# Patient Record
Sex: Male | Born: 1985
Health system: Southern US, Community
[De-identification: ages and names within clinical notes are randomized; demographics above are authoritative.]

## PROBLEM LIST (undated history)

## (undated) DIAGNOSIS — J45909 Unspecified asthma, uncomplicated: Secondary | ICD-10-CM

## (undated) DIAGNOSIS — F909 Attention-deficit hyperactivity disorder, unspecified type: Secondary | ICD-10-CM

## (undated) DIAGNOSIS — T7840XA Allergy, unspecified, initial encounter: Secondary | ICD-10-CM

## (undated) DIAGNOSIS — G4730 Sleep apnea, unspecified: Secondary | ICD-10-CM

## (undated) HISTORY — PX: MANDIBLE SURGERY: SHX707

## (undated) HISTORY — PX: TOE SURGERY: SHX1073

## (undated) HISTORY — DX: Attention-deficit hyperactivity disorder, unspecified type: F90.9

## (undated) HISTORY — DX: Sleep apnea, unspecified: G47.30

---

## 2005-07-13 ENCOUNTER — Emergency Department: Payer: Self-pay | Admitting: General Practice

## 2008-12-21 ENCOUNTER — Emergency Department: Payer: Self-pay | Admitting: Emergency Medicine

## 2009-02-08 ENCOUNTER — Emergency Department: Payer: Self-pay | Admitting: Emergency Medicine

## 2009-08-10 ENCOUNTER — Ambulatory Visit: Payer: Self-pay | Admitting: Otolaryngology

## 2012-01-05 ENCOUNTER — Emergency Department: Payer: Self-pay | Admitting: Emergency Medicine

## 2013-03-18 ENCOUNTER — Emergency Department: Payer: Self-pay | Admitting: Emergency Medicine

## 2016-09-07 ENCOUNTER — Ambulatory Visit (INDEPENDENT_AMBULATORY_CARE_PROVIDER_SITE_OTHER): Payer: Managed Care, Other (non HMO) | Admitting: Podiatry

## 2016-09-07 ENCOUNTER — Encounter: Payer: Self-pay | Admitting: Podiatry

## 2016-09-07 VITALS — BP 124/97 | HR 104 | Resp 16

## 2016-09-07 DIAGNOSIS — M79676 Pain in unspecified toe(s): Secondary | ICD-10-CM

## 2016-09-07 DIAGNOSIS — L600 Ingrowing nail: Secondary | ICD-10-CM

## 2016-09-07 DIAGNOSIS — L03039 Cellulitis of unspecified toe: Secondary | ICD-10-CM

## 2016-09-07 NOTE — Patient Instructions (Signed)

## 2016-09-17 NOTE — Progress Notes (Signed)
   Subjective: Patient presents today for evaluation of pain in toe(s). Patient is concerned for possible ingrown nail. Patient states that the pain has been present for a few weeks now. Patient presents today for further treatment and evaluation.  Objective:  General: Well developed, nourished, in no acute distress, alert and oriented x3   Dermatology: Skin is warm, dry and supple bilateral. Lateral border of the left great toe appears to be erythematous with evidence of an ingrowing nail. Pain on palpation noted to the border of the nail fold. The remaining nails appear unremarkable at this time. There are no open sores, lesions.  Vascular: Dorsalis Pedis artery and Posterior Tibial artery pedal pulses palpable. No lower extremity edema noted.   Neruologic: Grossly intact via light touch bilateral.  Musculoskeletal: Muscular strength within normal limits in all groups bilateral. Normal range of motion noted to all pedal and ankle joints.   Assesement: #1 Paronychia with ingrowing nail lateral border left great toe #2 Pain in toe #3 Incurvated nail  Plan of Care:  1. Patient evaluated.  2. Discussed treatment alternatives and plan of care. Explained nail avulsion procedure and post procedure course to patient. 3. Patient opted for permanent partial nail avulsion.  4. Prior to procedure, local anesthesia infiltration utilized using 3 ml of a 50:50 mixture of 2% plain lidocaine and 0.5% plain marcaine in a normal hallux block fashion and a betadine prep performed.  5. Partial permanent nail avulsion with chemical matrixectomy performed using [id number] applications of phenol followed by alcohol flush.  6. Light dressing applied. 7. Return to clinic in 2 weeks.   Edrick Kins, DPM [facility]  Dr. Edrick Kins, [place]                                        [place], [place] [address]                Office [phone]  Fax [phone]

## 2016-09-21 ENCOUNTER — Ambulatory Visit: Payer: Managed Care, Other (non HMO) | Admitting: Podiatry

## 2016-09-21 ENCOUNTER — Ambulatory Visit (INDEPENDENT_AMBULATORY_CARE_PROVIDER_SITE_OTHER): Payer: Managed Care, Other (non HMO) | Admitting: Podiatry

## 2016-09-21 DIAGNOSIS — M79676 Pain in unspecified toe(s): Secondary | ICD-10-CM

## 2016-09-21 DIAGNOSIS — S91209D Unspecified open wound of unspecified toe(s) with damage to nail, subsequent encounter: Secondary | ICD-10-CM

## 2016-09-21 DIAGNOSIS — S91109D Unspecified open wound of unspecified toe(s) without damage to nail, subsequent encounter: Secondary | ICD-10-CM

## 2016-09-21 NOTE — Progress Notes (Signed)
   Subjective: Patient presents today 2 weeks post ingrown nail permanent nail avulsion procedure. Patient states that the toe and nail fold is feeling much better.  Objective: Skin is warm, dry and supple. Nail and respective nail fold appears to be healing appropriately. Open wound to the associated nail fold with a granular wound base and moderate amount of fibrotic tissue. Minimal drainage noted. Mild erythema around the periungual region likely due to phenol chemical matricectomy.  Assessment: #1 postop permanent partial nail avulsion #2 open wound periungual nail fold of respective digit.   Plan of care: #1 patient was evaluated  #2 debridement of open wound was performed to the periungual border of the respective toe using a currette. Antibiotic ointment and Band-Aid was applied. #3 patient is to return to clinic on a PRN  basis.   Edrick Kins, DPM [facility]  Dr. Edrick Kins, [place]                                        [place], [place] [address]                Office [phone]  Fax [phone]

## 2017-05-03 ENCOUNTER — Encounter
Admission: EM | Disposition: A | Discharge status: Home / Self Care | Payer: Self-pay | Source: Home / Self Care | Attending: Emergency Medicine

## 2017-05-03 ENCOUNTER — Emergency Department
Admission: EM | Admit: 2017-05-03 | Discharge: 2017-05-03 | Disposition: A | Discharge status: Home / Self Care | Payer: Commercial Managed Care - PPO | Attending: Emergency Medicine | Admitting: Emergency Medicine

## 2017-05-03 ENCOUNTER — Emergency Department: Payer: Commercial Managed Care - PPO | Admitting: Certified Registered Nurse Anesthetist

## 2017-05-03 ENCOUNTER — Emergency Department: Payer: Commercial Managed Care - PPO

## 2017-05-03 ENCOUNTER — Other Ambulatory Visit: Payer: Self-pay

## 2017-05-03 ENCOUNTER — Encounter: Payer: Self-pay | Admitting: Emergency Medicine

## 2017-05-03 DIAGNOSIS — Z79899 Other long term (current) drug therapy: Secondary | ICD-10-CM | POA: Insufficient documentation

## 2017-05-03 DIAGNOSIS — E669 Obesity, unspecified: Secondary | ICD-10-CM | POA: Insufficient documentation

## 2017-05-03 DIAGNOSIS — X58XXXA Exposure to other specified factors, initial encounter: Secondary | ICD-10-CM | POA: Diagnosis not present

## 2017-05-03 DIAGNOSIS — T18128A Food in esophagus causing other injury, initial encounter: Secondary | ICD-10-CM

## 2017-05-03 DIAGNOSIS — K222 Esophageal obstruction: Secondary | ICD-10-CM | POA: Diagnosis present

## 2017-05-03 DIAGNOSIS — K200 Eosinophilic esophagitis: Secondary | ICD-10-CM | POA: Diagnosis not present

## 2017-05-03 DIAGNOSIS — Z6841 Body Mass Index (BMI) 40.0 and over, adult: Secondary | ICD-10-CM | POA: Insufficient documentation

## 2017-05-03 DIAGNOSIS — R112 Nausea with vomiting, unspecified: Secondary | ICD-10-CM

## 2017-05-03 DIAGNOSIS — Z87891 Personal history of nicotine dependence: Secondary | ICD-10-CM | POA: Insufficient documentation

## 2017-05-03 DIAGNOSIS — J45909 Unspecified asthma, uncomplicated: Secondary | ICD-10-CM | POA: Insufficient documentation

## 2017-05-03 HISTORY — PX: ESOPHAGOGASTRODUODENOSCOPY (EGD) WITH PROPOFOL: SHX5813

## 2017-05-03 HISTORY — PX: FOREIGN BODY REMOVAL: SHX962

## 2017-05-03 HISTORY — DX: Unspecified asthma, uncomplicated: J45.909

## 2017-05-03 SURGERY — REMOVAL, FOREIGN BODY
Anesthesia: General

## 2017-05-03 MED ORDER — SODIUM CHLORIDE 0.9 % IV BOLUS (SEPSIS)
1000.0000 mL | Freq: Once | INTRAVENOUS | Status: AC
  Administered 2017-05-03: 1000 mL via INTRAVENOUS

## 2017-05-03 MED ORDER — SUCCINYLCHOLINE CHLORIDE 20 MG/ML IJ SOLN
INTRAMUSCULAR | Status: DC | PRN
  Administered 2017-05-03: 200 mg via INTRAVENOUS

## 2017-05-03 MED ORDER — PROPOFOL 10 MG/ML IV BOLUS
INTRAVENOUS | Status: DC | PRN
  Administered 2017-05-03: 200 mg via INTRAVENOUS

## 2017-05-03 MED ORDER — FENTANYL CITRATE (PF) 100 MCG/2ML IJ SOLN
25.0000 ug | INTRAMUSCULAR | Status: DC | PRN
  Administered 2017-05-03: 100 ug via INTRAVENOUS

## 2017-05-03 MED ORDER — SODIUM CHLORIDE 0.9 % IV SOLN
INTRAVENOUS | Status: DC
  Administered 2017-05-03: 16:00:00 via INTRAVENOUS

## 2017-05-03 MED ORDER — ONDANSETRON HCL 4 MG/2ML IJ SOLN
INTRAMUSCULAR | Status: AC
  Filled 2017-05-03: qty 2

## 2017-05-03 MED ORDER — FENTANYL CITRATE (PF) 100 MCG/2ML IJ SOLN
INTRAMUSCULAR | Status: AC
  Filled 2017-05-03: qty 2

## 2017-05-03 MED ORDER — SUGAMMADEX SODIUM 500 MG/5ML IV SOLN
INTRAVENOUS | Status: DC | PRN
  Administered 2017-05-03: 272.2 mg via INTRAVENOUS

## 2017-05-03 MED ORDER — GLUCAGON HCL RDNA (DIAGNOSTIC) 1 MG IJ SOLR
1.0000 mg | Freq: Once | INTRAMUSCULAR | Status: AC
  Administered 2017-05-03: 1 mg via INTRAVENOUS
  Filled 2017-05-03 (×2): qty 1

## 2017-05-03 MED ORDER — ONDANSETRON HCL 4 MG/2ML IJ SOLN
4.0000 mg | Freq: Once | INTRAMUSCULAR | Status: AC | PRN
  Administered 2017-05-03: 4 mg via INTRAVENOUS

## 2017-05-03 MED ORDER — SUGAMMADEX SODIUM 500 MG/5ML IV SOLN
INTRAVENOUS | Status: AC
  Filled 2017-05-03: qty 5

## 2017-05-03 MED ORDER — ROCURONIUM BROMIDE 50 MG/5ML IV SOLN
INTRAVENOUS | Status: AC
Start: 2017-05-03 — End: ?
  Filled 2017-05-03: qty 1

## 2017-05-03 MED ORDER — MIDAZOLAM HCL 2 MG/2ML IJ SOLN
INTRAMUSCULAR | Status: DC | PRN
  Administered 2017-05-03: 2 mg via INTRAVENOUS

## 2017-05-03 MED ORDER — SUCCINYLCHOLINE CHLORIDE 20 MG/ML IJ SOLN
INTRAMUSCULAR | Status: AC
  Filled 2017-05-03: qty 1

## 2017-05-03 MED ORDER — PROPOFOL 10 MG/ML IV BOLUS
INTRAVENOUS | Status: AC
  Filled 2017-05-03: qty 20

## 2017-05-03 MED ORDER — ROCURONIUM BROMIDE 100 MG/10ML IV SOLN
INTRAVENOUS | Status: DC | PRN
  Administered 2017-05-03: 10 mg via INTRAVENOUS

## 2017-05-03 MED ORDER — MIDAZOLAM HCL 2 MG/2ML IJ SOLN
INTRAMUSCULAR | Status: AC
  Filled 2017-05-03: qty 2

## 2017-05-03 NOTE — ED Notes (Signed)
Discussed patient with Dr. Clearnce Hasten.  Denied need for orders at this time.  Will continue to monitor patient.

## 2017-05-03 NOTE — Anesthesia Post-op Follow-up Note (Signed)
Anesthesia QCDR form completed.        

## 2017-05-03 NOTE — Anesthesia Preprocedure Evaluation (Signed)
Anesthesia Evaluation  Patient identified by MRN, date of birth, ID band Patient awake    Reviewed: Allergy & Precautions, NPO status , Patient's Chart, lab work & pertinent test results, reviewed documented beta blocker date and time   Airway Mallampati: III  TM Distance: >3 FB     Dental  (+) Chipped   Pulmonary asthma , former smoker,           Cardiovascular      Neuro/Psych    GI/Hepatic   Endo/Other    Renal/GU      Musculoskeletal   Abdominal   Peds  Hematology   Anesthesia Other Findings Obese.  Reproductive/Obstetrics                             Anesthesia Physical Anesthesia Plan  ASA: III  Anesthesia Plan: General   Post-op Pain Management:    Induction: Intravenous  PONV Risk Score and Plan:   Airway Management Planned: Oral ETT  Additional Equipment:   Intra-op Plan:   Post-operative Plan:   Informed Consent: I have reviewed the patients History and Physical, chart, labs and discussed the procedure including the risks, benefits and alternatives for the proposed anesthesia with the patient or authorized representative who has indicated his/her understanding and acceptance.     Plan Discussed with: CRNA  Anesthesia Plan Comments:         Anesthesia Quick Evaluation

## 2017-05-03 NOTE — ED Notes (Signed)
Patient taken to endoscopy

## 2017-05-03 NOTE — Consult Note (Signed)
Steven Carrillo , MD [address] [place], [place], [place], [address] [address], [place], [place], [place], [address] Phone: [phone]  Fax: [phone]  Consultation  Referring Provider:   ER Primary Care Physician:  Patient, No Pcp Per Primary Gastroenterologist: None          Reason for Consultation:     Food impaction   Date of Admission:  05/03/2017 Date of Consultation:  05/03/2017         HPI:   Steven Carrillo is a 31 y.o. male here today after he presented to the ER this morning with food stuck in his throat,says he had a chicken wrap for dinner last night and felt has been stuck since, has had similar episodes in the past which resolved on their own.   H/o asthma , no nsaid use.  Past Medical History:  Diagnosis Date  . Asthma     Past Surgical History:  Procedure Laterality Date  . MANDIBLE SURGERY    . TOE SURGERY      Prior to Admission medications   Medication Sig Start Date End Date Taking? Authorizing Provider  ibuprofen (ADVIL,MOTRIN) 200 MG tablet Take 200 mg every 6 (six) hours as needed by mouth.   Yes [provider]  albuterol (PROVENTIL HFA;VENTOLIN HFA) 108 (90 Base) MCG/ACT inhaler Inhale into the lungs. 08/23/14   [provider]    History reviewed. No pertinent family history.   Social History   Tobacco Use  . Smoking status: Former Research scientist (life sciences)  . Smokeless tobacco: Never Used  Substance Use Topics  . Alcohol use: Yes    Comment: social drinker  . Drug use: No    Allergies as of 05/03/2017  . (No Known Allergies)    Review of Systems:    All systems reviewed and negative except where noted in HPI.   Physical Exam:  Vital signs in last 24 hours: Temp:  [97.9 F (36.6 C)-98.9 F (37.2 C)] 97.9 F (36.6 C) (11/09 1505) Pulse Rate:  [79-83] 83 (11/09 1505) Resp:  [18-20] 20 (11/09 1505) BP: (135-146)/(80-86) 146/86 (11/09 1505) SpO2:  [98 %] 98 % (11/09 1013) Weight:  [300 lb (136.1 kg)] 300 lb (136.1 kg)  (11/09 1505)   General:   Pleasant, cooperative in NAD Head:  Normocephalic and atraumatic. Eyes:   No icterus.   Conjunctiva pink. PERRLA. Ears:  Normal auditory acuity. Neck:  Supple; no masses or thyroidomegaly Lungs: Respirations even and unlabored. Lungs clear to auscultation bilaterally.   No wheezes, crackles, or rhonchi.  Heart:  Regular rate and rhythm;  Without murmur, clicks, rubs or gallops Abdomen:  Soft, nondistended, nontender. Normal bowel sounds. No appreciable masses or hepatomegaly.  No rebound or guarding.  Neurologic:  Alert and oriented x3;  grossly normal neurologically. Skin:  Intact without significant lesions or rashes. Cervical Nodes:  No significant cervical adenopathy. Psych:  Alert and cooperative. Normal affect.  LAB RESULTS: No results for input(s): WBC, HGB, HCT, PLT in the last 72 hours. BMET No results for input(s): NA, K, CL, CO2, GLUCOSE, BUN, CREATININE, CALCIUM in the last 72 hours. LFT No results for input(s): PROT, ALBUMIN, AST, ALT, ALKPHOS, BILITOT, BILIDIR, IBILI in the last 72 hours. PT/INR No results for input(s): LABPROT, INR in the last 72 hours.  STUDIES: Dg Chest 2 View  Result Date: 05/03/2017 CLINICAL DATA:  Esophageal foreign body. EXAM: CHEST  2 VIEW COMPARISON:  None. FINDINGS: The heart size and mediastinal contours are within  normal limits. Both lungs are clear. No evidence of aspiration. No apparent radiopaque foreign body. No esophageal dilatation. The visualized skeletal structures are unremarkable. IMPRESSION: No active cardiopulmonary disease. No radiopaque foreign body identified. No evidence of aspiration. Electronically Signed   By: Ashley Royalty M.D.   On: 05/03/2017 13:19      Impression / Plan:   Steven Carrillo is a 31 y.o. y/o male with a food impaction , history of asthma.   Plan  1. EGD   I have discussed alternative options, risks & benefits,  which include, but are not limited to, bleeding, infection,  perforation,respiratory complication & drug reaction.  The patient agrees with this plan & written consent will be obtained.     Thank you for involving me in the care of this patient.      LOS: 0 days   Steven Bellows, MD  05/03/2017, 3:16 PM

## 2017-05-03 NOTE — H&P (Signed)
           [name], MD [address] [place], [place], [place], [address] [address], [place], [place], [place], [address] Phone: [phone]  Fax: [phone]  Primary Care Physician:  Patient, No Pcp Per   Pre-Procedure History & Physical: HPI:  Steven Carrillo is a 31 y.o. male is here for an endoscopy    Past Medical History:  Diagnosis Date  . Asthma     Past Surgical History:  Procedure Laterality Date  . MANDIBLE SURGERY    . TOE SURGERY      Prior to Admission medications   Medication Sig Start Date End Date Taking? Authorizing Provider  ibuprofen (ADVIL,MOTRIN) 200 MG tablet Take 200 mg every 6 (six) hours as needed by mouth.   Yes [provider]  albuterol (PROVENTIL HFA;VENTOLIN HFA) 108 (90 Base) MCG/ACT inhaler Inhale into the lungs. 08/23/14   [provider]    Allergies as of 05/03/2017  . (No Known Allergies)    History reviewed. No pertinent family history.  Social History   Socioeconomic History  . Marital status: Single    Spouse name: Not on file  . Number of children: Not on file  . Years of education: Not on file  . Highest education level: Not on file  Social Needs  . Financial resource strain: Not on file  . Food insecurity - worry: Not on file  . Food insecurity - inability: Not on file  . Transportation needs - medical: Not on file  . Transportation needs - non-medical: Not on file  Occupational History  . Not on file  Tobacco Use  . Smoking status: Former Research scientist (life sciences)  . Smokeless tobacco: Never Used  Substance and Sexual Activity  . Alcohol use: Yes    Comment: social drinker  . Drug use: No  . Sexual activity: Not on file  Other Topics Concern  . Not on file  Social History Narrative  . Not on file    Review of Systems: See HPI, otherwise negative ROS  Physical Exam: BP (!) 146/86   Pulse 83   Temp 97.9 F (36.6 C) (Tympanic)   Resp 20   Ht 6' (1.829 m)   Wt 300 lb (136.1 kg)   SpO2  98%   BMI 40.69 kg/m  General:   Alert,  pleasant and cooperative in NAD Head:  Normocephalic and atraumatic. Neck:  Supple; no masses or thyromegaly. Lungs:  Clear throughout to auscultation, normal respiratory effort.    Heart:  +S1, +S2, Regular rate and rhythm, No edema. Abdomen:  Soft, nontender and nondistended. Normal bowel sounds, without guarding, and without rebound.   Neurologic:  Alert and  oriented x4;  grossly normal neurologically.  Impression/Plan: Steven Carrillo is here for an endoscopy  to be performed for  A food impaction     Risks, benefits, limitations, and alternatives regarding endoscopy have been reviewed with the patient.  Questions have been answered.  All parties agreeable.   [name], MD  05/03/2017, 3:14 PM

## 2017-05-03 NOTE — Transfer of Care (Signed)
Immediate Anesthesia Transfer of Care Note  Patient: Steven Carrillo  Procedure(s) Performed: FOREIGN BODY REMOVAL (N/A ) ESOPHAGOGASTRODUODENOSCOPY (EGD) WITH PROPOFOL (N/A )  Patient Location: PACU  Anesthesia Type:General  Level of Consciousness: sedated  Airway & Oxygen Therapy: Patient Spontanous Breathing and Patient connected to face mask oxygen  Post-op Assessment: Report given to RN  Post vital signs: Reviewed and stable  Last Vitals:  Vitals:   05/03/17 1013 05/03/17 1505  BP: 135/80 (!) 146/86  Pulse: 79 83  Resp: 18 20  Temp: 37.2 C 36.6 C  SpO2: 98%     Last Pain:  Vitals:   05/03/17 1505  TempSrc: Tympanic  PainSc: 5       Patients Stated Pain Goal: 0 (74/16/38 4536)  Complications: No apparent anesthesia complications

## 2017-05-03 NOTE — ED Provider Notes (Signed)
[facility] Emergency Department Provider Note  ____________________________________________  Time seen: Approximately 2:23 PM  I have reviewed the triage vital signs and the nursing notes.   HISTORY  Chief Complaint Dysphagia    HPI GRANT SWAGER is a 31 y.o. male he complains of chest pain and inability to swallow since yesterday afternoon. At dinnertime yesterday he was eating a chicken wrap, and he felt like it got stuck in his throat. He vomited, but since then has been unable to swallow. He's been vomiting anytime she eats or drinks anything. No fevers or chills. No shortness of breath. Never had anything like this before.     Past Medical History:  Diagnosis Date  . Asthma      There are no active problems to display for this patient.    Past Surgical History:  Procedure Laterality Date  . TOE SURGERY       Prior to Admission medications   Medication Sig Start Date End Date Taking? Authorizing Provider  albuterol (PROVENTIL HFA;VENTOLIN HFA) 108 (90 Base) MCG/ACT inhaler Inhale into the lungs. 08/23/14   [provider]     Allergies Patient has no known allergies.   No family history on file.  Social History Social History   Tobacco Use  . Smoking status: Former Research scientist (life sciences)  . Smokeless tobacco: Never Used  Substance Use Topics  . Alcohol use: Yes    Comment: social drinker  . Drug use: No    Review of Systems  Constitutional:   No fever or chills.  ENT:   No sore throat. No rhinorrhea. Cardiovascular:   central chest pain withoutsyncope. Respiratory:   No dyspnea or cough. Gastrointestinal:   Negative for abdominal pain, vomiting and diarrhea.  Musculoskeletal:   Negative for focal pain or swelling All other systems reviewed and are negative except as documented above in ROS and HPI.  ____________________________________________   PHYSICAL EXAM:  VITAL SIGNS: ED Triage Vitals  Enc Vitals Group     BP  05/03/17 1013 135/80     Pulse Rate 05/03/17 1013 79     Resp 05/03/17 1013 18     Temp 05/03/17 1013 98.9 F (37.2 C)     Temp Source 05/03/17 1013 Oral     SpO2 05/03/17 1013 98 %     Weight 05/03/17 0953 300 lb (136.1 kg)     Height 05/03/17 0953 6' (1.829 m)     Head Circumference --      [place] Flow --      Pain Score 05/03/17 0952 5     Pain Loc --      Pain Edu? --      Excl. in [place]? --     Vital signs reviewed, nursing assessments reviewed.   Constitutional:   Alert and oriented. Well appearing and in no distress. Eyes:   No scleral icterus.  EOMI. No nystagmus. No conjunctival pallor. PERRL. ENT   Head:   Normocephalic and atraumatic.   Nose:   No congestion/rhinnorhea.    Mouth/Throat:   MMM, no pharyngeal erythema. No peritonsillar mass.    Neck:   No meningismus. Full ROM.crepitus Hematological/Lymphatic/Immunilogical:   No cervical lymphadenopathy. Cardiovascular:   RRR. Symmetric bilateral radial and DP pulses.  No murmurs.  Respiratory:   Normal respiratory effort without tachypnea/retractions. Breath sounds are clear and equal bilaterally. No wheezes/rales/rhonchi. Gastrointestinal:   Soft and nontender. Non distended. There is no CVA tenderness.  No rebound, rigidity, or guarding. Genitourinary:  deferred Musculoskeletal:   Normal range of motion in all extremities. No joint effusions.  No lower extremity tenderness.  No edema. Neurologic:   Normal speech and language.  Motor grossly intact. No gross focal neurologic deficits are appreciated.  Skin:    Skin is warm, dry and intact. No rash noted.  No petechiae, purpura, or bullae.  ____________________________________________    LABS (pertinent positives/negatives) (all labs ordered are listed, but only abnormal results are displayed) Labs Reviewed - No data to display ____________________________________________   EKG    ____________________________________________    RADIOLOGY  Dg  Chest 2 View  Result Date: 05/03/2017 CLINICAL DATA:  Esophageal foreign body. EXAM: CHEST  2 VIEW COMPARISON:  None. FINDINGS: The heart size and mediastinal contours are within normal limits. Both lungs are clear. No evidence of aspiration. No apparent radiopaque foreign body. No esophageal dilatation. The visualized skeletal structures are unremarkable. IMPRESSION: No active cardiopulmonary disease. No radiopaque foreign body identified. No evidence of aspiration. Electronically Signed   By: Ashley Royalty M.D.   On: 05/03/2017 13:19    ____________________________________________   PROCEDURES Procedures  ____________________________________________     CLINICAL IMPRESSION / ASSESSMENT AND PLAN / ED COURSE  Pertinent labs & imaging results that were available during my care of the patient were reviewed by me and considered in my medical decision making (see chart for details).   patient well appearing, but with persistent vomiting, and intolerance of oral intake. Presentation consistent with esophageal food impaction. Case discussed with Dr. of gastroenterology who requests glucagon and plans to do endoscopy at 3:30 PM. Patient's medically stable otherwise to proceed with endoscopy .  Considering the patient's symptoms, medical history, and physical examination today, I have low suspicion for ACS, PE, TAD, pneumothorax, carditis, mediastinitis, pneumonia, CHF, or sepsis.        ____________________________________________   FINAL CLINICAL IMPRESSION(S) / ED DIAGNOSES    Final diagnoses:  Esophageal obstruction due to food impaction  Nausea and vomiting, intractability of vomiting not specified, unspecified vomiting type      This SmartLink is deprecated. Use AVSMEDLIST instead to display the medication list for a patient.   Portions of this note were generated with dragon dictation software. Dictation errors may occur despite best attempts at proofreading.     Carrie Mew, MD 05/03/17 [phone]

## 2017-05-03 NOTE — Anesthesia Postprocedure Evaluation (Signed)
Anesthesia Post Note  Patient: Steven Carrillo  Procedure(s) Performed: FOREIGN BODY REMOVAL (N/A ) ESOPHAGOGASTRODUODENOSCOPY (EGD) WITH PROPOFOL (N/A )  Patient location during evaluation: Endoscopy Anesthesia Type: General Level of consciousness: awake and alert Pain management: pain level controlled Vital Signs Assessment: post-procedure vital signs reviewed and stable Respiratory status: spontaneous breathing, nonlabored ventilation, respiratory function stable and patient connected to nasal cannula oxygen Cardiovascular status: blood pressure returned to baseline and stable Postop Assessment: no apparent nausea or vomiting Anesthetic complications: no     Last Vitals:  Vitals:   05/03/17 1624 05/03/17 1629  BP: (!) 150/93 124/74  Pulse: 93 84  Resp: 13 12  Temp:    SpO2: 97% 97%    Last Pain:  Vitals:   05/03/17 1616  TempSrc:   PainSc: 0-No pain                 Celie Desrochers S

## 2017-05-03 NOTE — ED Notes (Signed)
Patient states he has been constipated since yesterday afternoon, patient states he is usually regular with BM's and has 2 - 3 per day. Patient's last BM was yesterday morning was small and hard.

## 2017-05-03 NOTE — ED Notes (Signed)
Patient updated on delays at this time. Patient verbalized understanding. Patient states he is tired because he couldn't sleep last night.  Patient states, "If you call me and I don't answer, it might be because I'm in the bathroom, trying to cough stuff up."

## 2017-05-03 NOTE — Anesthesia Procedure Notes (Signed)
Procedure Name: Intubation Date/Time: 05/03/2017 3:47 PM Performed by: Nelda Marseille, CRNA Pre-anesthesia Checklist: Patient identified, Patient being monitored, Timeout performed, Emergency Drugs available and Suction available Patient Re-evaluated:Patient Re-evaluated prior to induction Oxygen Delivery Method: Circle system utilized Preoxygenation: Pre-oxygenation with 100% oxygen Induction Type: IV induction Ventilation: Mask ventilation without difficulty Laryngoscope Size: Mac, 3 and McGraph Grade View: Grade IV Tube type: Oral Tube size: 7.0 mm Number of attempts: 1 Airway Equipment and Method: Stylet and Video-laryngoscopy Placement Confirmation: ETT inserted through vocal cords under direct vision,  positive ETCO2 and breath sounds checked- equal and bilateral Secured at: 21 cm Tube secured with: Tape Dental Injury: Teeth and Oropharynx as per pre-operative assessment

## 2017-05-03 NOTE — ED Triage Notes (Signed)
Patient presents to the ED from [facility] with difficulty eating or drinking.  Patient states he ate a chicken wrap last night and couldn't swallow it very well so he vomited it back up.  Patient states, "I feel like I have air trapped down because I keep having air bubbles that come up."  Patient reports he is vomiting every time he eats or drinks.  Patient states he is having some jaw pain from fillings that were placed approx. 1 week ago.  Patient is in no obvious distress.  Patient also reports some constipation.

## 2017-05-06 ENCOUNTER — Encounter: Payer: Self-pay | Admitting: Gastroenterology

## 2017-05-06 NOTE — Op Note (Signed)
[facility]astroenterology Patient Name: Steven Carrillo Procedure Date: 05/03/2017 3:25 PM MRN: [id number] Account #: [id number] Date of Birth: 1986/01/27 Admit Type: Outpatient Age: 31 Room: [facility] ENDO ROOM 4 Gender: Male Note Status: Finalized Procedure:            Upper GI endoscopy Indications:          Dysphagia Providers:            [name] MD, MD Referring MD:         No Local Md, MD (Referring MD) Medicines:            General Anesthesia Complications:        No immediate complications. Procedure:            Pre-Anesthesia Assessment:                       - ASA Grade Assessment: III - A patient with severe                        systemic disease.                       - Prior to the procedure, a History and Physical was                        performed, and patient medications, allergies and                        sensitivities were reviewed. The patient's tolerance of                        previous anesthesia was reviewed.                       - The risks and benefits of the procedure and the                        sedation options and risks were discussed with the                        patient. All questions were answered and informed                        consent was obtained.                       - Prior to the procedure, a History and Physical was                        performed, and patient medications, allergies and                        sensitivities were reviewed. The patient's tolerance of                        previous anesthesia was reviewed.                       After obtaining informed consent, the endoscope was                        passed  under direct vision. Throughout the procedure,                        the patient's blood pressure, pulse, and oxygen                        saturations were monitored continuously. The Endoscope                        was introduced through the mouth, and advanced to the        third part of duodenum. The upper GI endoscopy was                        accomplished with ease. The patient tolerated the                        procedure well. Findings:      The examined duodenum was normal.      The stomach was normal.      Food was found in the lower third of the esophagus. Removal of food was       accomplished. Biopsies were obtained from the proximal and distal       esophagus with cold forceps for histology of suspected eosinophilic       esophagitis. Impression:           - Normal examined duodenum.                       - Normal stomach.                       - Food in the lower third of the esophagus. Biopsied.                        Removal was successful. Recommendation:       - Discharge patient to home (with escort).                       - Resume previous diet.                       - Continue present medications.                       - Await pathology results.                       - Return to my office in 4 weeks. Procedure Code(s):    --- Professional ---                       [phone], Esophagogastroduodenoscopy, flexible, transoral;                        with removal of foreign body(s)                       [id number], Esophagogastroduodenoscopy, flexible, transoral;                        with biopsy, single or multiple Diagnosis Code(s):    --- Professional ---  P29.518A, Food in esophagus causing other injury,                        initial encounter                       R13.10, Dysphagia, unspecified CPT copyright 2016 American Medical Association. All rights reserved. The codes documented in this report are preliminary and upon coder review may  be revised to meet current compliance requirements. [name], MD [name] MD, MD 05/03/2017 3:50:29 PM This report has been signed electronically. Number of Addenda: 0 Note Initiated On: 05/03/2017 3:25 PM      [facility]

## 2017-05-07 ENCOUNTER — Telehealth: Payer: Self-pay

## 2017-05-07 DIAGNOSIS — J4520 Mild intermittent asthma, uncomplicated: Secondary | ICD-10-CM | POA: Insufficient documentation

## 2017-05-07 DIAGNOSIS — Z6841 Body Mass Index (BMI) 40.0 and over, adult: Secondary | ICD-10-CM

## 2017-05-07 LAB — SURGICAL PATHOLOGY

## 2017-05-07 NOTE — Telephone Encounter (Signed)
-----   Message from [name], MD sent at 05/07/2017  1:48 PM EST ----- Steven Carrillo  Please inform patient to see me in the office- his biopsies suggest he may have eosinophilic esophagitis

## 2017-05-07 NOTE — Telephone Encounter (Signed)
LVM for patient callback for results per Dr. Vicente Males.   -  Please inform patient to see me in the office- his biopsies suggest he may have eosinophilic esophagitis

## 2017-06-05 ENCOUNTER — Encounter (INDEPENDENT_AMBULATORY_CARE_PROVIDER_SITE_OTHER): Payer: Self-pay

## 2017-06-05 ENCOUNTER — Ambulatory Visit: Payer: Commercial Managed Care - PPO | Admitting: Gastroenterology

## 2017-06-05 ENCOUNTER — Encounter: Payer: Self-pay | Admitting: Gastroenterology

## 2017-06-05 VITALS — BP 131/80 | HR 92 | Temp 98.0°F | Ht 73.0 in | Wt 328.4 lb

## 2017-06-05 DIAGNOSIS — K200 Eosinophilic esophagitis: Secondary | ICD-10-CM | POA: Diagnosis not present

## 2017-06-05 MED ORDER — OMEPRAZOLE 40 MG PO CPDR: 40.0000 mg | DELAYED_RELEASE_CAPSULE | Freq: Every day | ORAL | 3 refills | Status: DC

## 2017-06-05 MED ORDER — FLUTICASONE PROPIONATE HFA 220 MCG/ACT IN AERO: INHALATION_SPRAY | RESPIRATORY_TRACT | 1 refills | Status: DC

## 2017-06-05 NOTE — Addendum Note (Signed)
Addended by: Peggye Ley on: 06/05/2017 09:32 AM   Modules accepted: Orders

## 2017-06-05 NOTE — Progress Notes (Signed)
   [name] MD, MRCP(U.K) [address]  [place]  [place], [place] [address]  Main: [phone]  Fax: [phone]   Primary Care Physician: Patient, No Pcp Per  Primary Gastroenterologist:  Dr. [name]   No chief complaint on file.   HPI: Steven Carrillo is a 31 y.o. male   He is here today for hospital follow-up.  I had initially seen him on 05/03/2017 he presented to the ER with food stuck in his throat.  He had similar episodes in the past but had resolved.I performed an upper endoscopy on the same day.  Food was found in the lower end of the esophagus which was removed.  I obtained biopsies of the esophagus as I suspect that he had esophagitis.The biopsies confirmed he had eosinophilic esophagitis due to presence of 45 eosinophils per high power field.   Since the procedure - no issues- cutting food into smaller pieces.   Current Outpatient Medications  Medication Sig Dispense Refill  . albuterol (PROVENTIL HFA) 108 (90 Base) MCG/ACT inhaler Inhale into the lungs.    Marland Kitchen albuterol (PROVENTIL HFA;VENTOLIN HFA) 108 (90 Base) MCG/ACT inhaler Inhale into the lungs.    . Cetirizine HCl (ZYRTEC ALLERGY) 10 MG CAPS Take by mouth.    Marland Kitchen HYDROcodone-acetaminophen (NORCO/VICODIN) 5-325 MG tablet Take by mouth.    . hydrocortisone (ANUSOL-HC) 25 MG suppository Place rectally.    Marland Kitchen ibuprofen (ADVIL,MOTRIN) 200 MG tablet Take 200 mg every 6 (six) hours as needed by mouth.     No current facility-administered medications for this visit.     Allergies as of 06/05/2017  . (No Known Allergies)    ROS:  General: Negative for anorexia, weight loss, fever, chills, fatigue, weakness. ENT: Negative for hoarseness, difficulty swallowing , nasal congestion. CV: Negative for chest pain, angina, palpitations, dyspnea on exertion, peripheral edema.  Respiratory: Negative for dyspnea at rest, dyspnea on exertion, cough, sputum, wheezing.  GI: See history of present illness. GU:  Negative  for dysuria, hematuria, urinary incontinence, urinary frequency, nocturnal urination.  Endo: Negative for unusual weight change.    Physical Examination:   There were no vitals taken for this visit.  General: Well-nourished, well-developed in no acute distress.  Eyes: No icterus. Conjunctivae pink. Mouth: Oropharyngeal mucosa moist and pink , no lesions erythema or exudate. Lungs: Clear to auscultation bilaterally. Non-labored. Heart: Regular rate and rhythm, no murmurs rubs or gallops.  Abdomen: Bowel sounds are normal, nontender, nondistended, no hepatosplenomegaly or masses, no abdominal bruits or hernia , no rebound or guarding.   Extremities: No lower extremity edema. No clubbing or deformities. Neuro: Alert and oriented x 3.  Grossly intact. Skin: Warm and dry, no jaundice.   Psych: Alert and cooperative, normal mood and affect.   Imaging Studies: No results found.  Assessment and Plan:   Steven Carrillo is a 31 y.o. y/o male here to follow up for a new diagnosis of eosinophilic esophagitis  Plan  1.Establish care with a PCP 2. Refer to allergy for testing  3. Fluticasone inhaler to be swallowed for 6 weeks 4. Daily PPI 5. Follow up with me in 8-12 weeks .  6. Patient information provided on eosinophilic esophagitis.     Dr [name]  MD,MRCP [facility]) Follow up in 12 weeks

## 2017-09-04 ENCOUNTER — Ambulatory Visit: Payer: Commercial Managed Care - PPO | Admitting: Physician Assistant

## 2017-09-04 ENCOUNTER — Encounter: Payer: Self-pay | Admitting: Physician Assistant

## 2017-09-04 VITALS — BP 130/72 | HR 88 | Temp 97.9°F | Resp 16 | Ht 73.0 in | Wt 327.0 lb

## 2017-09-04 DIAGNOSIS — F419 Anxiety disorder, unspecified: Secondary | ICD-10-CM

## 2017-09-04 DIAGNOSIS — L740 Miliaria rubra: Secondary | ICD-10-CM

## 2017-09-04 DIAGNOSIS — E66813 Obesity, class 3: Secondary | ICD-10-CM

## 2017-09-04 DIAGNOSIS — J4520 Mild intermittent asthma, uncomplicated: Secondary | ICD-10-CM

## 2017-09-04 DIAGNOSIS — K200 Eosinophilic esophagitis: Secondary | ICD-10-CM

## 2017-09-04 DIAGNOSIS — R0683 Snoring: Secondary | ICD-10-CM

## 2017-09-04 DIAGNOSIS — R400 Somnolence: Secondary | ICD-10-CM

## 2017-09-04 DIAGNOSIS — Z6841 Body Mass Index (BMI) 40.0 and over, adult: Secondary | ICD-10-CM

## 2017-09-04 NOTE — Progress Notes (Signed)
Patient: Steven Carrillo Male    DOB: 26-Oct-1985   32 y.o.   MRN: [id number] Visit Date: 09/04/2017  Today's Provider: Mar Daring, PA-C   Chief Complaint  Patient presents with  . New Patient (Initial Visit)   Subjective:    HPI Patient comes in today to establish care into the practice. No prvious PCP in 4-5 years.  He feels well with minor complaints. He reports that he has sleep apnea, and he would like to have another sleep study done. He reports snoring, difficulty falling asleep, and daytime somnolence. Epworth score today in the office was 3.   He also has complaints about anxiety. He reports some of his sleep issues stem from "not being able to shut my brain off."   Has a rash that comes on his chest when he gets hot. Doesn't happen often but is worse during the summer. Feels "prickly" to the skin.     No Known Allergies   Current Outpatient Medications:  .  albuterol (PROVENTIL HFA) 108 (90 Base) MCG/ACT inhaler, Inhale into the lungs., Disp: , Rfl:  .  Cetirizine HCl (ZYRTEC ALLERGY) 10 MG CAPS, Take by mouth., Disp: , Rfl:  .  ibuprofen (ADVIL,MOTRIN) 200 MG tablet, Take 200 mg every 6 (six) hours as needed by mouth., Disp: , Rfl:  .  omeprazole (PRILOSEC) 40 MG capsule, Take 1 capsule (40 mg total) by mouth daily., Disp: 90 capsule, Rfl: 3 .  fluticasone (FLOVENT HFA) 220 MCG/ACT inhaler, Take 2 puffs and swallow twice a day before breakfast and bedtime. Rinse mouth after each use. (Patient not taking: Reported on 09/04/2017), Disp: 1 Inhaler, Rfl: 1 .  HYDROcodone-acetaminophen (NORCO/VICODIN) 5-325 MG tablet, Take by mouth., Disp: , Rfl:   Review of Systems  Constitutional: Positive for fatigue.  HENT: Positive for sinus pressure.   Eyes: Negative.   Respiratory: Positive for apnea.   Cardiovascular: Negative.   Gastrointestinal: Negative.   Endocrine: Negative.   Genitourinary: Negative.   Musculoskeletal: Positive for back pain.  Skin: Positive  for rash.  Allergic/Immunologic: Negative.   Neurological: Negative.   Hematological: Negative.   Psychiatric/Behavioral: Positive for sleep disturbance. The patient is nervous/anxious.     Social History   Tobacco Use  . Smoking status: Former Research scientist (life sciences)  . Smokeless tobacco: Never Used  Substance Use Topics  . Alcohol use: Yes    Comment: social drinker   Objective:   BP 130/72 (BP Location: Right Arm, Patient Position: Sitting, Cuff Size: Large)   Pulse 88   Temp 97.9 F (36.6 C)   Resp 16   Ht 6\' 1"  (1.854 m)   Wt (!) 327 lb (148.3 kg)   BMI 43.14 kg/m  Vitals:   09/04/17 0911  BP: 130/72  Pulse: 88  Resp: 16  Temp: 97.9 F (36.6 C)  Weight: (!) 327 lb (148.3 kg)  Height: 6\' 1"  (1.854 m)     Physical Exam  Constitutional: He appears well-developed and well-nourished. No distress.  obese  HENT:  Head: Normocephalic and atraumatic.  Neck: Normal range of motion. Neck supple. No tracheal deviation present. No thyromegaly present.  Cardiovascular: Normal rate, regular rhythm and normal heart sounds. Exam reveals no gallop and no friction rub.  No murmur heard. Pulmonary/Chest: Effort normal and breath sounds normal. No respiratory distress. He has no wheezes. He has no rales.  Lymphadenopathy:    He has no cervical adenopathy.  Skin: He is not diaphoretic.  Psychiatric:  He has a normal mood and affect. His behavior is normal. Judgment and thought content normal.  Vitals reviewed.      Assessment & Plan:     1. Establishing care with new doctor, encounter for Previous patient of Dr. Lavera Guise. Not seen in 4-5 years.   2. Eosinophilic esophagitis Referral had been placed by Dr. Vicente Males, GI, but never scheduled. New order placed for follow through. Patient was seen in Nov 2018 at [facility] ER for food stuck in throat. Dr. Vicente Males felt it was secondary to a food allergy that caused his throat to swell like it had. Patient denies any recurrence with using omeprazole daily. Has  not been compliant with swallowing the flovent. He is interested in allergy testing so he would know which foods to avoid in the future. He is scheduled to f/u with Dr. Vicente Males in April 2019.  - Ambulatory referral to Allergy  3. Snoring Low Epworth score but with daytime somnolence, snoring and obesity he may have OSA and be a candidate for CPAP. Referral placed to sleep center. Patient does work third shift. Make sure to leave VM during the day as he will be sleeping and normally may not answer his phone when calling to schedule.  - Ambulatory referral to Sleep Studies  4. Daytime somnolence See above medical treatment plan. - Ambulatory referral to Sleep Studies  5. Class 3 severe obesity due to excess calories without serious comorbidity with body mass index (BMI) of 40.0 to 44.9 in adult [facility]) See above medical treatment plan. Counseled patient on healthy lifestyle modifications including dieting and exercise.  - Ambulatory referral to Sleep Studies  6. Mild intermittent asthma without complication Stable on albuterol inhaler prn.  7. Heat rash Benadryl cream prn.   8. Anxiety Discussed mindfulness, sleep hygiene and sleep meditation techniques. Will f/u after sleep study in 3 months for CPE and to see if changes are noted. If not, will consider adding trazodone.        Mar Daring, PA-C  [facility]

## 2017-10-04 ENCOUNTER — Ambulatory Visit: Payer: Commercial Managed Care - PPO | Attending: Otolaryngology

## 2017-10-04 DIAGNOSIS — G4733 Obstructive sleep apnea (adult) (pediatric): Secondary | ICD-10-CM | POA: Insufficient documentation

## 2017-10-04 DIAGNOSIS — F5101 Primary insomnia: Secondary | ICD-10-CM | POA: Diagnosis present

## 2017-10-09 ENCOUNTER — Ambulatory Visit: Payer: Commercial Managed Care - PPO | Admitting: Gastroenterology

## 2017-10-25 ENCOUNTER — Ambulatory Visit: Payer: Commercial Managed Care - PPO | Attending: Neurology

## 2017-10-25 DIAGNOSIS — F5101 Primary insomnia: Secondary | ICD-10-CM | POA: Insufficient documentation

## 2017-10-25 DIAGNOSIS — G4733 Obstructive sleep apnea (adult) (pediatric): Secondary | ICD-10-CM | POA: Diagnosis present

## 2017-10-30 ENCOUNTER — Ambulatory Visit: Payer: Commercial Managed Care - PPO | Admitting: Gastroenterology

## 2017-10-30 ENCOUNTER — Encounter: Payer: Self-pay | Admitting: Gastroenterology

## 2017-10-30 VITALS — BP 138/91 | HR 96 | Ht 73.0 in | Wt 337.6 lb

## 2017-10-30 DIAGNOSIS — K200 Eosinophilic esophagitis: Secondary | ICD-10-CM | POA: Diagnosis not present

## 2017-10-30 NOTE — Patient Instructions (Signed)

## 2017-10-30 NOTE — Progress Notes (Signed)
[name] MD, MRCP(U.K) [address]  [place]  [place], [place] [address]  Main: [phone]  Fax: [phone]   Primary Care Physician: Mar Daring, PA-C  Primary Gastroenterologist:  Dr. [name]   No chief complaint on file.   HPI: Steven Carrillo is a 32 y.o. male  Summary of history :  He is here today to follow up for eosinophilic esophagitis.   I had initially seen him on 05/03/2017 he presented to the ER with food stuck in his throat.  He had similar episodes in the past but had resolved.I performed an upper endoscopy on the same day.  Food was found in the lower end of the esophagus which was removed.  I obtained biopsies of the esophagus as I suspect that he had esophagitis.The biopsies confirmed he had eosinophilic esophagitis due to presence of 45 eosinophils per high power field.    Interval history   05/12/2017-  10/30/2017  He has been referred to allergy for food testing by Mar Daring, PA-C  Says he is doing well , feels the flovent did not help. The omeprazole says has worked and takes it daily and hence he says he has been having no issues. Has been eating steak and chicken . Had allergy testing and says he was allergic to many foods.   Current Outpatient Medications  Medication Sig Dispense Refill  . albuterol (PROVENTIL HFA) 108 (90 Base) MCG/ACT inhaler Inhale into the lungs.    Marland Kitchen azelastine (ASTELIN) 0.1 % nasal spray   3  . Cetirizine HCl (ZYRTEC ALLERGY) 10 MG CAPS Take by mouth.    . fluticasone (FLOVENT HFA) 220 MCG/ACT inhaler Take 2 puffs and swallow twice a day before breakfast and bedtime. Rinse mouth after each use. (Patient not taking: Reported on 09/04/2017) 1 Inhaler 1  . ibuprofen (ADVIL,MOTRIN) 200 MG tablet Take 200 mg every 6 (six) hours as needed by mouth.    Marland Kitchen omeprazole (PRILOSEC) 40 MG capsule Take 1 capsule (40 mg total) by mouth daily. 90 capsule 3   No current facility-administered medications for this  visit.     Allergies as of 10/30/2017  . (No Known Allergies)    ROS:  General: Negative for anorexia, weight loss, fever, chills, fatigue, weakness. ENT: Negative for hoarseness, difficulty swallowing , nasal congestion. CV: Negative for chest pain, angina, palpitations, dyspnea on exertion, peripheral edema.  Respiratory: Negative for dyspnea at rest, dyspnea on exertion, cough, sputum, wheezing.  GI: See history of present illness. GU:  Negative for dysuria, hematuria, urinary incontinence, urinary frequency, nocturnal urination.  Endo: Negative for unusual weight change.    Physical Examination:   There were no vitals taken for this visit.  General: Well-nourished, well-developed in no acute distress.  Eyes: No icterus. Conjunctivae pink. Mouth: Oropharyngeal mucosa moist and pink , no lesions erythema or exudate. Lungs: Clear to auscultation bilaterally. Non-labored. Heart: Regular rate and rhythm, no murmurs rubs or gallops.  Abdomen: Bowel sounds are normal, nontender, nondistended, no hepatosplenomegaly or masses, no abdominal bruits or hernia , no rebound or guarding.   Extremities: No lower extremity edema. No clubbing or deformities. Neuro: Alert and oriented x 3.  Grossly intact. Skin: Warm and dry, no jaundice.   Psych: Alert and cooperative, normal mood and affect.   Imaging Studies: No results found.  Assessment and Plan:   Steven Carrillo is a 32 y.o. y/o male  here to follow up for eosinophilic esophagitis  Plan  1. Daily PPI  2. F/u PRN if symptoms recur then can try budesonide slurry - advised to lose weight to help with GERD.   Dr [name]  MD,MRCP [facility]) Follow up PRN .

## 2018-01-06 ENCOUNTER — Ambulatory Visit: Payer: Commercial Managed Care - PPO | Admitting: Physician Assistant

## 2018-01-06 ENCOUNTER — Encounter: Payer: Self-pay | Admitting: Physician Assistant

## 2018-01-06 VITALS — BP 140/88 | HR 85 | Temp 98.2°F | Resp 16 | Ht 73.0 in | Wt 335.2 lb

## 2018-01-06 DIAGNOSIS — M79671 Pain in right foot: Secondary | ICD-10-CM | POA: Diagnosis not present

## 2018-01-06 DIAGNOSIS — J301 Allergic rhinitis due to pollen: Secondary | ICD-10-CM

## 2018-01-06 DIAGNOSIS — M79672 Pain in left foot: Secondary | ICD-10-CM

## 2018-01-06 DIAGNOSIS — Z6841 Body Mass Index (BMI) 40.0 and over, adult: Secondary | ICD-10-CM

## 2018-01-06 DIAGNOSIS — H1013 Acute atopic conjunctivitis, bilateral: Secondary | ICD-10-CM | POA: Diagnosis not present

## 2018-01-06 DIAGNOSIS — K200 Eosinophilic esophagitis: Secondary | ICD-10-CM

## 2018-01-06 DIAGNOSIS — F411 Generalized anxiety disorder: Secondary | ICD-10-CM

## 2018-01-06 MED ORDER — AZELASTINE HCL 0.05 % OP SOLN: 1.0000 [drp] | Freq: Two times a day (BID) | OPHTHALMIC | 12 refills | Status: DC

## 2018-01-06 NOTE — Progress Notes (Signed)
Patient: Steven Carrillo Male    DOB: 04-16-1986   32 y.o.   MRN: [id number] Visit Date: 01/06/2018  Today's Provider: Mar Daring, PA-C   Chief Complaint  Patient presents with  . Follow-up    daytime somnolence   Subjective:    HPI Patient is here today for 3 month follow-up Snoring and daytime somnolence. Patient had Sleep study done 10/04/17 at [facility].  Patient reports that he is sleeping better with the Cpap machine. "I love it".  Anxiety: Patient was seen for this 3 months ago. Discussed mindfulness, sleep hygiene and sleep meditation techniques. Consider Trazodone after Sleep Study. Patient reports he is sleeping better. Still has daily anxiety symptoms, but states "I think every one has anxiety to some extent." Feels like he is coping well.   Allergies: He reports he wants to try the allergies shots. Feels his allergies are not any better.  He also wants a referral to podiatrist for his feet. He reports they hurt a lot and he knows is because of his weight.  GAD 7 : Generalized Anxiety Score 01/06/2018  Nervous, Anxious, on Edge 2  Control/stop worrying 1  Worry too much - different things 3  Trouble relaxing 1  Restless 2  Easily annoyed or irritable 2  Afraid - awful might happen 3  Total GAD 7 Score 14  Anxiety Difficulty Somewhat difficult      No Known Allergies   Current Outpatient Medications:  .  albuterol (PROVENTIL HFA) 108 (90 Base) MCG/ACT inhaler, Inhale into the lungs., Disp: , Rfl:  .  azelastine (ASTELIN) 0.1 % nasal spray, , Disp: , Rfl: 3 .  Cetirizine HCl (ZYRTEC ALLERGY) 10 MG CAPS, Take by mouth., Disp: , Rfl:  .  ibuprofen (ADVIL,MOTRIN) 200 MG tablet, Take 200 mg every 6 (six) hours as needed by mouth., Disp: , Rfl:  .  omeprazole (PRILOSEC) 40 MG capsule, Take 1 capsule (40 mg total) by mouth daily., Disp: 90 capsule, Rfl: 3  Review of Systems  Respiratory: Positive for shortness of breath.   Cardiovascular: Negative for  chest pain, palpitations and leg swelling.  Musculoskeletal: Positive for arthralgias, gait problem and myalgias. Negative for joint swelling.  Neurological: Negative for weakness and numbness.    Social History   Tobacco Use  . Smoking status: Former Research scientist (life sciences)  . Smokeless tobacco: Never Used  Substance Use Topics  . Alcohol use: Yes    Comment: social drinker  kk Objective:   BP 140/88 (BP Location: Left Wrist, Patient Position: Sitting, Cuff Size: Normal)   Pulse 85   Temp 98.2 F (36.8 C) (Oral)   Resp 16   Ht 6\' 1"  (1.854 m)   Wt (!) 335 lb 3.2 oz (152 kg)   BMI 44.22 kg/m  Vitals:   01/06/18 1632  BP: 140/88  Pulse: 85  Resp: 16  Temp: 98.2 F (36.8 C)  TempSrc: Oral  Weight: (!) 335 lb 3.2 oz (152 kg)  Height: 6\' 1"  (1.854 m)     Physical Exam  Constitutional: He appears well-developed and well-nourished. No distress.  HENT:  Head: Normocephalic and atraumatic.  Neck: Normal range of motion. Neck supple. No JVD present. No tracheal deviation present. No thyromegaly present.  Cardiovascular: Normal rate, regular rhythm and normal heart sounds. Exam reveals no gallop and no friction rub.  No murmur heard. Pulmonary/Chest: Effort normal and breath sounds normal. No respiratory distress. He has no wheezes. He has no rales.  Lymphadenopathy:    He has no cervical adenopathy.  Skin: He is not diaphoretic.  Vitals reviewed.      Assessment & Plan:     1. Pain in both feet Fairly flat arches with obesity. Some mild plantar fasciitis symptoms as well. Will refer to podiatry as below for further evaluation and consideration of arch support orthotics. - Ambulatory referral to Podiatry  2. Eosinophilic esophagitis due to food Patient was seen by allergist and had multiple allergy test done. Wants to be referred back for consideration of allergy shots. Currently on Zyrtec, allergy eye drops and benadryl with symptom breakthrough.  - Ambulatory referral to  Allergy  3. Non-seasonal allergic rhinitis due to pollen See above medical treatment plan. - Ambulatory referral to Allergy  4. Allergic conjunctivitis of both eyes Will change eye drops to optivar as below. Allergist referral placed.  - azelastine (OPTIVAR) 0.05 % ophthalmic solution; Place 1 drop into both eyes 2 (two) times daily.  Dispense: 6 mL; Refill: 12  5. GAD (generalized anxiety disorder) Using self coping mechanisms. He feels he is doing well.   6. Class 3 severe obesity due to excess calories with serious comorbidity and body mass index (BMI) of 40.0 to 44.9 in adult [facility]) Counseled patient on healthy lifestyle modifications including dieting and exercise.        Mar Daring, PA-C  [facility]

## 2018-01-09 ENCOUNTER — Encounter: Payer: Self-pay | Admitting: Physician Assistant

## 2018-01-09 DIAGNOSIS — F411 Generalized anxiety disorder: Secondary | ICD-10-CM | POA: Insufficient documentation

## 2018-01-09 DIAGNOSIS — K200 Eosinophilic esophagitis: Secondary | ICD-10-CM | POA: Insufficient documentation

## 2018-01-09 DIAGNOSIS — J301 Allergic rhinitis due to pollen: Secondary | ICD-10-CM | POA: Insufficient documentation

## 2018-01-09 DIAGNOSIS — H1013 Acute atopic conjunctivitis, bilateral: Secondary | ICD-10-CM | POA: Insufficient documentation

## 2018-01-31 ENCOUNTER — Telehealth: Payer: Self-pay

## 2018-01-31 DIAGNOSIS — K200 Eosinophilic esophagitis: Secondary | ICD-10-CM

## 2018-01-31 MED ORDER — OMEPRAZOLE 40 MG PO CPDR: 40.0000 mg | DELAYED_RELEASE_CAPSULE | Freq: Every day | ORAL | 3 refills | Status: DC

## 2018-01-31 NOTE — Telephone Encounter (Signed)
Sent to [facility]

## 2018-01-31 NOTE — Telephone Encounter (Signed)
Patient called requesting a refill on omeprazole to his mail order pharmacy. Patient reports that he does not know the name of the pharmacy, but Tawanna Sat wrote down the number at the time of his last OV. Please advise. Thanks!

## 2018-02-10 ENCOUNTER — Ambulatory Visit (INDEPENDENT_AMBULATORY_CARE_PROVIDER_SITE_OTHER): Payer: Commercial Managed Care - PPO

## 2018-02-10 ENCOUNTER — Encounter: Payer: Self-pay | Admitting: Podiatry

## 2018-02-10 ENCOUNTER — Other Ambulatory Visit: Payer: Self-pay | Admitting: Podiatry

## 2018-02-10 ENCOUNTER — Ambulatory Visit: Payer: Commercial Managed Care - PPO | Admitting: Podiatry

## 2018-02-10 DIAGNOSIS — M722 Plantar fascial fibromatosis: Secondary | ICD-10-CM

## 2018-02-10 DIAGNOSIS — M779 Enthesopathy, unspecified: Principal | ICD-10-CM

## 2018-02-10 DIAGNOSIS — M778 Other enthesopathies, not elsewhere classified: Secondary | ICD-10-CM

## 2018-02-10 MED ORDER — MELOXICAM 15 MG PO TABS: 15.0000 mg | ORAL_TABLET | Freq: Every day | ORAL | 3 refills | Status: DC

## 2018-02-10 MED ORDER — METHYLPREDNISOLONE 4 MG PO TBPK: ORAL_TABLET | ORAL | 0 refills | Status: DC

## 2018-02-10 NOTE — Patient Instructions (Signed)
For instructions on how to put on your Plantar Fascial Brace, please visit [email or web address] For instructions on how to put on your Night Splint, please visit [email or web address]   Plantar Fasciitis (Heel Spur Syndrome) with Rehab The plantar fascia is a fibrous, ligament-like, soft-tissue structure that spans the bottom of the foot. Plantar fasciitis is a condition that causes pain in the foot due to inflammation of the tissue. SYMPTOMS   Pain and tenderness on the underneath side of the foot.  Pain that worsens with standing or walking. CAUSES  Plantar fasciitis is caused by irritation and injury to the plantar fascia on the underneath side of the foot. Common mechanisms of injury include:  Direct trauma to bottom of the foot.  Damage to a small nerve that runs under the foot where the main fascia attaches to the heel bone.  Stress placed on the plantar fascia due to bone spurs. RISK INCREASES WITH:   Activities that place stress on the plantar fascia (running, jumping, pivoting, or cutting).  Poor strength and flexibility.  Improperly fitted shoes.  Tight calf muscles.  Flat feet.  Failure to warm-up properly before activity.  Obesity. PREVENTION  Warm up and stretch properly before activity.  Allow for adequate recovery between workouts.  Maintain physical fitness:  Strength, flexibility, and endurance.  Cardiovascular fitness.  Maintain a health body weight.  Avoid stress on the plantar fascia.  Wear properly fitted shoes, including arch supports for individuals who have flat feet.  PROGNOSIS  If treated properly, then the symptoms of plantar fasciitis usually resolve without surgery. However, occasionally surgery is necessary.  RELATED COMPLICATIONS   Recurrent symptoms that may result in a chronic condition.  Problems of the lower back that are caused by compensating for the injury, such as limping.  Pain or weakness of the foot during  push-off following surgery.  Chronic inflammation, scarring, and partial or complete fascia tear, occurring more often from repeated injections.  TREATMENT  Treatment initially involves the use of ice and medication to help reduce pain and inflammation. The use of strengthening and stretching exercises may help reduce pain with activity, especially stretches of the Achilles tendon. These exercises may be performed at home or with a therapist. Your caregiver may recommend that you use heel cups of arch supports to help reduce stress on the plantar fascia. Occasionally, corticosteroid injections are given to reduce inflammation. If symptoms persist for greater than 6 months despite non-surgical (conservative), then surgery may be recommended.   MEDICATION   If pain medication is necessary, then nonsteroidal anti-inflammatory medications, such as aspirin and ibuprofen, or other minor pain relievers, such as acetaminophen, are often recommended.  Do not take pain medication within 7 days before surgery.  Prescription pain relievers may be given if deemed necessary by your caregiver. Use only as directed and only as much as you need.  Corticosteroid injections may be given by your caregiver. These injections should be reserved for the most serious cases, because they may only be given a certain number of times.  HEAT AND COLD  Cold treatment (icing) relieves pain and reduces inflammation. Cold treatment should be applied for 10 to 15 minutes every 2 to 3 hours for inflammation and pain and immediately after any activity that aggravates your symptoms. Use ice packs or massage the area with a piece of ice (ice massage).  Heat treatment may be used prior to performing the stretching and strengthening activities prescribed by your caregiver, physical therapist, or athletic trainer. Use a  heat pack or soak the injury in warm water.  SEEK IMMEDIATE MEDICAL CARE IF:  Treatment seems to offer no benefit,  or the condition worsens.  Any medications produce adverse side effects.  EXERCISES- RANGE OF MOTION (ROM) AND STRETCHING EXERCISES - Plantar Fasciitis (Heel Spur Syndrome) These exercises may help you when beginning to rehabilitate your injury. Your symptoms may resolve with or without further involvement from your physician, physical therapist or athletic trainer. While completing these exercises, remember:   Restoring tissue flexibility helps normal motion to return to the joints. This allows healthier, less painful movement and activity.  An effective stretch should be held for at least 30 seconds.  A stretch should never be painful. You should only feel a gentle lengthening or release in the stretched tissue.  RANGE OF MOTION - Toe Extension, Flexion  Sit with your right / left leg crossed over your opposite knee.  Grasp your toes and gently pull them back toward the top of your foot. You should feel a stretch on the bottom of your toes and/or foot.  Hold this stretch for 10 seconds.  Now, gently pull your toes toward the bottom of your foot. You should feel a stretch on the top of your toes and or foot.  Hold this stretch for 10 seconds. Repeat  times. Complete this stretch 3 times per day.   RANGE OF MOTION - Ankle Dorsiflexion, Active Assisted  Remove shoes and sit on a chair that is preferably not on a carpeted surface.  Place right / left foot under knee. Extend your opposite leg for support.  Keeping your heel down, slide your right / left foot back toward the chair until you feel a stretch at your ankle or calf. If you do not feel a stretch, slide your bottom forward to the edge of the chair, while still keeping your heel down.  Hold this stretch for 10 seconds. Repeat 3 times. Complete this stretch 2 times per day.   STRETCH  Gastroc, Standing  Place hands on wall.  Extend right / left leg, keeping the front knee somewhat bent.  Slightly point your toes inward  on your back foot.  Keeping your right / left heel on the floor and your knee straight, shift your weight toward the wall, not allowing your back to arch.  You should feel a gentle stretch in the right / left calf. Hold this position for 10 seconds. Repeat 3 times. Complete this stretch 2 times per day.  STRETCH  Soleus, Standing  Place hands on wall.  Extend right / left leg, keeping the other knee somewhat bent.  Slightly point your toes inward on your back foot.  Keep your right / left heel on the floor, bend your back knee, and slightly shift your weight over the back leg so that you feel a gentle stretch deep in your back calf.  Hold this position for 10 seconds. Repeat 3 times. Complete this stretch 2 times per day.  STRETCH  Gastrocsoleus, Standing  Note: This exercise can place a lot of stress on your foot and ankle. Please complete this exercise only if specifically instructed by your caregiver.   Place the ball of your right / left foot on a step, keeping your other foot firmly on the same step.  Hold on to the wall or a rail for balance.  Slowly lift your other foot, allowing your body weight to press your heel down over the edge of the step.  You  should feel a stretch in your right / left calf.  Hold this position for 10 seconds.  Repeat this exercise with a slight bend in your right / left knee. Repeat 3 times. Complete this stretch 2 times per day.   STRENGTHENING EXERCISES - Plantar Fasciitis (Heel Spur Syndrome)  These exercises may help you when beginning to rehabilitate your injury. They may resolve your symptoms with or without further involvement from your physician, physical therapist or athletic trainer. While completing these exercises, remember:   Muscles can gain both the endurance and the strength needed for everyday activities through controlled exercises.  Complete these exercises as instructed by your physician, physical therapist or athletic  trainer. Progress the resistance and repetitions only as guided.  STRENGTH - Towel Curls  Sit in a chair positioned on a non-carpeted surface.  Place your foot on a towel, keeping your heel on the floor.  Pull the towel toward your heel by only curling your toes. Keep your heel on the floor. Repeat 3 times. Complete this exercise 2 times per day.  STRENGTH - Ankle Inversion  Secure one end of a rubber exercise band/tubing to a fixed object (table, pole). Loop the other end around your foot just before your toes.  Place your fists between your knees. This will focus your strengthening at your ankle.  Slowly, pull your big toe up and in, making sure the band/tubing is positioned to resist the entire motion.  Hold this position for 10 seconds.  Have your muscles resist the band/tubing as it slowly pulls your foot back to the starting position. Repeat 3 times. Complete this exercises 2 times per day.  Document Released: 06/11/2005 Document Revised: 09/03/2011 Document Reviewed: 09/23/2008 [facility] Patient Information 2014 [place], [place].

## 2018-02-10 NOTE — Progress Notes (Signed)
He presents today chief complaint of pain to the right heel to a lesser degree of the left heel states that is pulling from his big toe to his heel fungus along his medial longitudinal arch.  Is that he used to have arches but they have fallen since he is been working for [employer]  Denies any trauma.  Objective: Vital signs are stable he is alert and oriented x3.  Pulses are palpable.  Neurologic sensorium is intact.  Degenerative flexors are intact.  Muscle strength is normal symmetrical bilateral.  Orthopedic evaluation demonstrates all joints distal ankle full range of motion without crepitation.  Changes evaluation demonstrates supple well-hydrated cutis no erythema edema saline strange odor.  He has pain on palpation medial calcaneal tubercle medial longitudinal arch right foot to a lesser degree of the left.  Radiographs taken today demonstrate soft tissue increase in density plantar calcaneal insertion site of the right heel.  Assessment: Plantar fasciitis bilaterally right greater than left.  Plan: Discussed etiology pathology conservative surgical therapies at this point time injected his right heel with 20 mg Kenalog 5 mg Marcaine after sterile Betadine skin prep.  Start him on Medrol Dosepak to be followed by meloxicam.  Discussed appropriate shoe gear stretching exercise ice therapy sugar modifications.  Plantar fascial brace bilaterally and a single night splint right.  At this point also requested that he follow-up with [facility] for a set of orthotics to be made.

## 2018-02-19 ENCOUNTER — Ambulatory Visit (INDEPENDENT_AMBULATORY_CARE_PROVIDER_SITE_OTHER): Payer: Commercial Managed Care - PPO | Admitting: Orthotics

## 2018-02-19 DIAGNOSIS — J301 Allergic rhinitis due to pollen: Secondary | ICD-10-CM

## 2018-02-19 DIAGNOSIS — M722 Plantar fascial fibromatosis: Secondary | ICD-10-CM | POA: Diagnosis not present

## 2018-02-19 DIAGNOSIS — S91109D Unspecified open wound of unspecified toe(s) without damage to nail, subsequent encounter: Secondary | ICD-10-CM

## 2018-02-19 NOTE — Progress Notes (Signed)

## 2018-03-06 ENCOUNTER — Telehealth: Payer: Self-pay | Admitting: Podiatry

## 2018-03-06 NOTE — Telephone Encounter (Signed)
Pt called checking to see if orthotics are in yet. He is a [place] pt. I had the [place] office check to see if they were there and they were not. I have left a message for [facility] to check the status of them but had to leave a message.  I have contacted pt and made him aware I am waiting on a call from the vendor to check status as they were not in the office.

## 2018-03-07 NOTE — Telephone Encounter (Signed)
[facility] called back and they are in the lab being made now and will ship when complete.  I have notified pt and told pt when they arrive in [place] they should call pt.

## 2018-03-24 ENCOUNTER — Encounter: Payer: Self-pay | Admitting: Physician Assistant

## 2018-03-24 ENCOUNTER — Ambulatory Visit: Payer: Commercial Managed Care - PPO | Admitting: Physician Assistant

## 2018-03-24 ENCOUNTER — Ambulatory Visit: Payer: Commercial Managed Care - PPO | Admitting: Podiatry

## 2018-03-24 VITALS — BP 118/84 | HR 82 | Temp 98.7°F | Resp 16 | Ht 73.0 in | Wt 328.0 lb

## 2018-03-24 DIAGNOSIS — F411 Generalized anxiety disorder: Secondary | ICD-10-CM

## 2018-03-24 DIAGNOSIS — Z6841 Body Mass Index (BMI) 40.0 and over, adult: Secondary | ICD-10-CM

## 2018-03-24 MED ORDER — CITALOPRAM HYDROBROMIDE 20 MG PO TABS: 20.0000 mg | ORAL_TABLET | Freq: Every day | ORAL | 1 refills | Status: DC

## 2018-03-24 NOTE — Patient Instructions (Signed)
10 Relaxation Techniques That Zap Stress Fast By Jeannette Moninger   Listen  Relax. You deserve it, it's good for you, and it takes less time than you think. You don't need a spa weekend or a retreat. Each of these stress-relieving tips can get you from OMG to om in less than 15 minutes. 1. Meditate  A few minutes of practice per day can help ease anxiety. "Research suggests that daily meditation may alter the brain's neural pathways, making you more resilient to stress," says psychologist Robbie Maller Hartman, PhD, a [place] health and wellness coach. It's simple. Sit up straight with both feet on the floor. Close your eyes. Focus your attention on reciting -- out loud or silently -- a positive mantra such as "I feel at peace" or "I love myself." Place one hand on your belly to sync the mantra with your breaths. Let any distracting thoughts float by like clouds. 2. Breathe Deeply  Take a 5-minute break and focus on your breathing. Sit up straight, eyes closed, with a hand on your belly. Slowly inhale through your nose, feeling the breath start in your abdomen and work its way to the top of your head. Reverse the process as you exhale through your mouth.  "Deep breathing counters the effects of stress by slowing the heart rate and lowering blood pressure," psychologist Judith Tutin, PhD, says. She's a certified life coach in [place], [place] 3. Be Present  Slow down.  "Take 5 minutes and focus on only one behavior with awareness," Tutin says. Notice how the air feels on your face when you're walking and how your feet feel hitting the ground. Enjoy the texture and taste of each bite of food. When you spend time in the moment and focus on your senses, you should feel less tense. 4. Reach Out  Your social network is one of your best tools for handling stress. Talk to others -- preferably face to face, or at least on the phone. Share what's going on. You can get a fresh perspective while keeping your  connection strong. 5. Tune In to Your Body  Mentally scan your body to get a sense of how stress affects it each day. Lie on your back, or sit with your feet on the floor. Start at your toes and work your way up to your scalp, noticing how your body feels.  "Simply be aware of places you feel tight or loose without trying to change anything," Tutin says. For 1 to 2 minutes, imagine each deep breath flowing to that body part. Repeat this process as you move your focus up your body, paying close attention to sensations you feel in each body part. 6. Decompress  Place a warm heat wrap around your neck and shoulders for 10 minutes. Close your eyes and relax your face, neck, upper chest, and back muscles. Remove the wrap, and use a tennis ball or foam roller to massage away tension.  "Place the ball between your back and the wall. Lean into the ball, and hold gentle pressure for up to 15 seconds. Then move the ball to another spot, and apply pressure," says Cathy Benninger, a nurse practitioner and assistant professor at [employer] [facility] [employer] [facility] in [place]. 7. Laugh Out Loud  A good belly laugh doesn't just lighten the load mentally. It lowers cortisol, your body's stress hormone, and boosts brain chemicals called endorphins, which help your mood. Lighten up by tuning in to your favorite sitcom or video, reading   the comics, or chatting with someone who makes you smile. 8. Crank Up the [place] shows that listening to soothing music can lower blood pressure, heart rate, and anxiety. "Create a playlist of songs or nature sounds (the ocean, a bubbling brook, birds chirping), and allow your mind to focus on the different melodies, instruments, or singers in the piece," Benninger says. You also can blow off steam by rocking out to more upbeat tunes -- or singing at the top of your lungs! 9. Get Moving  You don't have to run in order to get a runner's high. All forms of exercise,  including yoga and walking, can ease depression and anxiety by helping the brain release feel-good chemicals and by giving your body a chance to practice dealing with stress. You can go for a quick walk around the block, take the stairs up and down a few flights, or do some stretching exercises like head rolls and shoulder shrugs. 10. Be Grateful  Keep a gratitude journal or several (one by your bed, one in your purse, and one at work) to help you remember all the things that are good in your life.  "Being grateful for your blessings cancels out negative thoughts and worries," says Joni Emmerling, a wellness coach in [place], [place].  Use these journals to savor good experiences like a child's smile, a sunshine-filled day, and good health. Don't forget to celebrate accomplishments like mastering a new task at work or a new hobby. When you start feeling stressed, spend a few minutes looking through your notes to remind yourself what really matters.  Citalopram tablets What is this medicine? CITALOPRAM (sye TAL oh pram) is a medicine for depression. This medicine may be used for other purposes; ask your health care provider or pharmacist if you have questions. COMMON BRAND NAME(S): Celexa What should I tell my health care provider before I take this medicine? They need to know if you have any of these conditions: -bleeding disorders -bipolar disorder or a family history of bipolar disorder -glaucoma -heart disease -history of irregular heartbeat -kidney disease -liver disease -low levels of magnesium or potassium in the blood -receiving electroconvulsive therapy -seizures -suicidal thoughts, plans, or attempt; a previous suicide attempt by you or a family member -take medicines that treat or prevent blood clots -thyroid disease -an unusual or allergic reaction to citalopram, escitalopram, other medicines, foods, dyes, or preservatives -pregnant or trying to become  pregnant -breast-feeding How should I use this medicine? Take this medicine by mouth with a glass of water. Follow the directions on the prescription label. You can take it with or without food. Take your medicine at regular intervals. Do not take your medicine more often than directed. Do not stop taking this medicine suddenly except upon the advice of your doctor. Stopping this medicine too quickly may cause serious side effects or your condition may worsen. A special MedGuide will be given to you by the pharmacist with each prescription and refill. Be sure to read this information carefully each time. Talk to your pediatrician regarding the use of this medicine in children. Special care may be needed. Patients over 2 years old may have a stronger reaction and need a smaller dose. Overdosage: If you think you have taken too much of this medicine contact a poison control center or emergency room at once. NOTE: This medicine is only for you. Do not share this medicine with others. What if I miss a dose? If you miss a dose, take  it as soon as you can. If it is almost time for your next dose, take only that dose. Do not take double or extra doses. What may interact with this medicine? Do not take this medicine with any of the following medications: -certain medicines for fungal infections like fluconazole, itraconazole, ketoconazole, posaconazole, voriconazole -cisapride -dofetilide -dronedarone -escitalopram -linezolid -MAOIs like Carbex, Eldepryl, Marplan, Nardil, and Parnate -methylene blue (injected into a vein) -pimozide -thioridazine -ziprasidone This medicine may also interact with the following medications: -alcohol -amphetamines -aspirin and aspirin-like medicines -carbamazepine -certain medicines for depression, anxiety, or psychotic disturbances -certain medicines for infections like chloroquine, clarithromycin, erythromycin, furazolidone, isoniazid, pentamidine -certain  medicines for migraine headaches like almotriptan, eletriptan, frovatriptan, naratriptan, rizatriptan, sumatriptan, zolmitriptan -certain medicines for sleep -certain medicines that treat or prevent blood clots like dalteparin, enoxaparin, warfarin -cimetidine -diuretics -fentanyl -lithium -methadone -metoprolol -NSAIDs, medicines for pain and inflammation, like ibuprofen or naproxen -omeprazole -other medicines that prolong the QT interval (cause an abnormal heart rhythm) -procarbazine -rasagiline -supplements like St. John's wort, kava kava, valerian -tramadol -tryptophan This list may not describe all possible interactions. Give your health care provider a list of all the medicines, herbs, non-prescription drugs, or dietary supplements you use. Also tell them if you smoke, drink alcohol, or use illegal drugs. Some items may interact with your medicine. What should I watch for while using this medicine? Tell your doctor if your symptoms do not get better or if they get worse. Visit your doctor or health care professional for regular checks on your progress. Because it may take several weeks to see the full effects of this medicine, it is important to continue your treatment as prescribed by your doctor. Patients and their families should watch out for new or worsening thoughts of suicide or depression. Also watch out for sudden changes in feelings such as feeling anxious, agitated, panicky, irritable, hostile, aggressive, impulsive, severely restless, overly excited and hyperactive, or not being able to sleep. If this happens, especially at the beginning of treatment or after a change in dose, call your health care professional. Dennis Bast may get drowsy or dizzy. Do not drive, use machinery, or do anything that needs mental alertness until you know how this medicine affects you. Do not stand or sit up quickly, especially if you are an older patient. This reduces the risk of dizzy or fainting spells.  Alcohol may interfere with the effect of this medicine. Avoid alcoholic drinks. Your mouth may get dry. Chewing sugarless gum or sucking hard candy, and drinking plenty of water will help. Contact your doctor if the problem does not go away or is severe. What side effects may I notice from receiving this medicine? Side effects that you should report to your doctor or health care professional as soon as possible: -allergic reactions like skin rash, itching or hives, swelling of the face, lips, or tongue -anxious -black, tarry stools -breathing problems -changes in vision -chest pain -confusion -elevated mood, decreased need for sleep, racing thoughts, impulsive behavior -eye pain -fast, irregular heartbeat -feeling faint or lightheaded, falls -feeling agitated, angry, or irritable -hallucination, loss of contact with reality -loss of balance or coordination -loss of memory -painful or prolonged erections -restlessness, pacing, inability to keep still -seizures -stiff muscles -suicidal thoughts or other mood changes -trouble sleeping -unusual bleeding or bruising -unusually weak or tired -vomiting Side effects that usually do not require medical attention (report to your doctor or health care professional if they continue or are bothersome): -change in appetite  or weight -change in sex drive or performance -dizziness -headache -increased sweating -indigestion, nausea -tremors This list may not describe all possible side effects. Call your doctor for medical advice about side effects. You may report side effects to FDA at 1-800-FDA-1088. Where should I keep my medicine? Keep out of reach of children. Store at room temperature between 15 and 30 degrees C (59 and 86 degrees F). Throw away any unused medicine after the expiration date. NOTE: This sheet is a summary. It may not cover all possible information. If you have questions about this medicine, talk to your doctor, pharmacist,  or health care provider.  2018 Elsevier/Gold Standard (2015-11-14 13:18:52)

## 2018-03-24 NOTE — Progress Notes (Signed)
Patient: Steven Carrillo Male    DOB: 1986-04-17   32 y.o.   MRN: [id number] Visit Date: 03/24/2018  Today's Provider: Mar Daring, PA-C   Chief Complaint  Patient presents with  . Anxiety   Subjective:  ANXIETY: Stress and anxiety issues has been going on for years and worse in the last two weeks.  Has a new position at work and some things are making him have a lot stress and panic attacks, and anxiety.  No treatment at this time.  He has had anxiety since he was in his early 16s. He has never sought treatment. Has had one counseling session through his employer a long time ago. Uses video games as stress relief. Has had worsening symptoms over the last 2 weeks. Worried he is going to make mistakes at work and get fired.   GAD 7 : Generalized Anxiety Score 03/24/2018 01/09/2018 01/06/2018  Nervous, Anxious, on Edge 2 2 2   Control/stop worrying 2 1 1   Worry too much - different things 3 3 3   Trouble relaxing 2 1 1   Restless 1 2 2   Easily annoyed or irritable 2 2 2   Afraid - awful might happen 2 3 3   Total GAD 7 Score 14 14 14   Anxiety Difficulty Very difficult Somewhat difficult Somewhat difficult      No Known Allergies   Current Outpatient Medications:  .  albuterol (PROVENTIL HFA) 108 (90 Base) MCG/ACT inhaler, Inhale into the lungs., Disp: , Rfl:  .  azelastine (ASTELIN) 0.1 % nasal spray, , Disp: , Rfl: 3 .  azelastine (OPTIVAR) 0.05 % ophthalmic solution, Place 1 drop into both eyes 2 (two) times daily., Disp: 6 mL, Rfl: 12 .  Cetirizine HCl (ZYRTEC ALLERGY) 10 MG CAPS, Take by mouth., Disp: , Rfl:  .  fluorometholone (FML) 0.1 % ophthalmic suspension, INSTILL 1 DROP INTO EACH EYE 4 TIMES DAILY AS DIRECTED. SHAKE WELL, Disp: , Rfl: 0 .  meloxicam (MOBIC) 15 MG tablet, Take 1 tablet (15 mg total) by mouth daily., Disp: 30 tablet, Rfl: 3 .  omeprazole (PRILOSEC) 40 MG capsule, Take 1 capsule (40 mg total) by mouth daily., Disp: 90 capsule, Rfl: 3 .  ibuprofen  (ADVIL,MOTRIN) 200 MG tablet, Take 200 mg every 6 (six) hours as needed by mouth., Disp: , Rfl:  .  methylPREDNISolone (MEDROL DOSEPAK) 4 MG TBPK tablet, 6 day dose pack - take as directed (Patient not taking: Reported on 03/24/2018), Disp: 21 tablet, Rfl: 0  Review of Systems  Constitutional: Negative.   Respiratory: Negative.   Cardiovascular: Negative.   Neurological: Negative.   Psychiatric/Behavioral: Positive for decreased concentration. Negative for agitation, dysphoric mood, self-injury and suicidal ideas. The patient is nervous/anxious.     Social History   Tobacco Use  . Smoking status: Never Smoker  . Smokeless tobacco: Never Used  Substance Use Topics  . 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 He appears well-developed and well-nourished. No distress.  Cardiovascular: Normal rate, regular rhythm, normal heart sounds and intact distal pulses. Exam reveals no gallop and no friction rub.  No murmur heard. Pulmonary/Chest: Effort normal. He has no wheezes. He has no rales. He exhibits no tenderness.  Neurological: He is alert and oriented to person, place, and time.  Skin: He is not diaphoretic.  Psychiatric: His speech is normal and behavior is normal. Judgment and thought content normal. His mood appears anxious. Cognition and memory are normal. He does not exhibit a depressed mood.       Assessment & Plan:     1. GAD (generalized anxiety disorder) Start Citalopram as below. I will see him back in 6 weeks for f/u. Call if symptoms worsen or adverse reactions  occur. - citalopram (CELEXA) 20 MG tablet; Take 1 tablet (20 mg total) by mouth daily.  Dispense: 30 tablet; Refill: 1 - Ambulatory referral to Psychology  2. BMI 40.0-44.9, adult [facility]) Counseled patient on healthy lifestyle modifications including dieting and exercise.        Mar Daring, PA-C  [facility]

## 2018-04-14 ENCOUNTER — Other Ambulatory Visit: Payer: Self-pay | Admitting: Physician Assistant

## 2018-04-14 DIAGNOSIS — J4520 Mild intermittent asthma, uncomplicated: Secondary | ICD-10-CM

## 2018-04-14 NOTE — Telephone Encounter (Signed)
Pt needs refill on his ventolin inhaler  [place]  CB# [phone]  Thanks Con Memos

## 2018-04-15 MED ORDER — ALBUTEROL SULFATE HFA 108 (90 BASE) MCG/ACT IN AERS: 1.0000 | INHALATION_SPRAY | Freq: Four times a day (QID) | RESPIRATORY_TRACT | 3 refills | Status: DC | PRN

## 2018-05-05 ENCOUNTER — Ambulatory Visit: Payer: Commercial Managed Care - PPO | Admitting: Physician Assistant

## 2018-05-05 ENCOUNTER — Encounter: Payer: Self-pay | Admitting: Physician Assistant

## 2018-05-05 VITALS — BP 130/70 | HR 86 | Temp 98.3°F | Resp 16 | Wt 335.2 lb

## 2018-05-05 DIAGNOSIS — R6882 Decreased libido: Secondary | ICD-10-CM

## 2018-05-05 DIAGNOSIS — F411 Generalized anxiety disorder: Secondary | ICD-10-CM | POA: Diagnosis not present

## 2018-05-05 DIAGNOSIS — Z6841 Body Mass Index (BMI) 40.0 and over, adult: Secondary | ICD-10-CM | POA: Diagnosis not present

## 2018-05-05 MED ORDER — CITALOPRAM HYDROBROMIDE 20 MG PO TABS: 20.0000 mg | ORAL_TABLET | Freq: Every day | ORAL | 1 refills | Status: DC

## 2018-05-05 NOTE — Progress Notes (Signed)
Patient: Steven Carrillo Male    DOB: September 29, 1985   32 y.o.   MRN: [id number] Visit Date: 05/05/2018  Today's Provider: Mar Daring, PA-C   Chief Complaint  Patient presents with  . Follow-up    GAD   Subjective:    HPI  GAD, Follow up:  The patient was last seen for this 6 weeks ago. Changes made since that visit include Start Citalopram 20 mg.  He reports excellent compliance with treatment. He is having side effects. He is having decreased libido. He states at this time he is not bothered terribly by this as he is not currently sexually active.  ------------------------------------------------------------------------    No Known Allergies   Current Outpatient Medications:  .  albuterol (PROVENTIL HFA) 108 (90 Base) MCG/ACT inhaler, Inhale 1-2 puffs into the lungs every 6 (six) hours as needed for wheezing or shortness of breath., Disp: 1 Inhaler, Rfl: 3 .  azelastine (ASTELIN) 0.1 % nasal spray, , Disp: , Rfl: 3 .  azelastine (OPTIVAR) 0.05 % ophthalmic solution, Place 1 drop into both eyes 2 (two) times daily., Disp: 6 mL, Rfl: 12 .  Cetirizine HCl (ZYRTEC ALLERGY) 10 MG CAPS, Take by mouth., Disp: , Rfl:  .  citalopram (CELEXA) 20 MG tablet, Take 1 tablet (20 mg total) by mouth daily., Disp: 30 tablet, Rfl: 1 .  fluorometholone (FML) 0.1 % ophthalmic suspension, INSTILL 1 DROP INTO EACH EYE 4 TIMES DAILY AS DIRECTED. SHAKE WELL, Disp: , Rfl: 0 .  ibuprofen (ADVIL,MOTRIN) 200 MG tablet, Take 200 mg every 6 (six) hours as needed by mouth., Disp: , Rfl:  .  meloxicam (MOBIC) 15 MG tablet, Take 1 tablet (15 mg total) by mouth daily., Disp: 30 tablet, Rfl: 3 .  omeprazole (PRILOSEC) 40 MG capsule, Take 1 capsule (40 mg total) by mouth daily., Disp: 90 capsule, Rfl: 3 .  methylPREDNISolone (MEDROL DOSEPAK) 4 MG TBPK tablet, 6 day dose pack - take as directed (Patient not taking: Reported on 03/24/2018), Disp: 21 tablet, Rfl: 0  Review of Systems  Constitutional:  Negative.   Respiratory: Negative.   Cardiovascular: Negative.   Neurological: Negative.   Psychiatric/Behavioral: Negative.     Social History   Tobacco Use  . Smoking status: Never Smoker  . Smokeless tobacco: Never Used  Substance Use Topics  . 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 No distress.  HENT:  Head: Normocephalic and atraumatic.  Neck: Normal range of motion. Neck supple.  Cardiovascular: Normal rate, regular rhythm and normal heart sounds. Exam reveals no gallop and no friction rub.  No murmur heard. Pulmonary/Chest: Effort normal and breath sounds normal. No respiratory distress. He has no wheezes. He has no rales.  Skin: He is not diaphoretic.  Psychiatric: He has a normal mood and affect. His behavior is normal. Judgment and thought content normal.  Vitals  reviewed.       Assessment & Plan:     1. GAD (generalized anxiety disorder) Symptoms improving. Reports he is less likely to snap or quickly respond inappropriately any longer. Medication pulled for refill. I will see him back in 3 months.  - citalopram (CELEXA) 20 MG tablet; Take 1 tablet (20 mg total) by mouth daily.  Dispense: 90 tablet; Refill: 1  2. Class 3 severe obesity due to excess calories with serious comorbidity and body mass index (BMI) of 40.0 to 44.9 in adult [facility]) Counseled patient on healthy lifestyle modifications including dieting and exercise.   3. Decreased libido Will check labs as below and f/u pending results. -  Testosterone       Mar Daring, PA-C  [facility]

## 2018-05-05 NOTE — Patient Instructions (Addendum)
Citalopram tablets What is this medicine? CITALOPRAM (sye TAL oh pram) is a medicine for depression. This medicine may be used for other purposes; ask your health care provider or pharmacist if you have questions. COMMON BRAND NAME(S): Celexa What should I tell my health care provider before I take this medicine? They need to know if you have any of these conditions: -bleeding disorders -bipolar disorder or a family history of bipolar disorder -glaucoma -heart disease -history of irregular heartbeat -kidney disease -liver disease -low levels of magnesium or potassium in the blood -receiving electroconvulsive therapy -seizures -suicidal thoughts, plans, or attempt; a previous suicide attempt by you or a family member -take medicines that treat or prevent blood clots -thyroid disease -an unusual or allergic reaction to citalopram, escitalopram, other medicines, foods, dyes, or preservatives -pregnant or trying to become pregnant -breast-feeding How should I use this medicine? Take this medicine by mouth with a glass of water. Follow the directions on the prescription label. You can take it with or without food. Take your medicine at regular intervals. Do not take your medicine more often than directed. Do not stop taking this medicine suddenly except upon the advice of your doctor. Stopping this medicine too quickly may cause serious side effects or your condition may worsen. A special MedGuide will be given to you by the pharmacist with each prescription and refill. Be sure to read this information carefully each time. Talk to your pediatrician regarding the use of this medicine in children. Special care may be needed. Patients over 60 years old may have a stronger reaction and need a smaller dose. Overdosage: If you think you have taken too much of this medicine contact a poison control center or emergency room at once. NOTE: This medicine is only for you. Do not share this medicine with  others. What if I miss a dose? If you miss a dose, take it as soon as you can. If it is almost time for your next dose, take only that dose. Do not take double or extra doses. What may interact with this medicine? Do not take this medicine with any of the following medications: -certain medicines for fungal infections like fluconazole, itraconazole, ketoconazole, posaconazole, voriconazole -cisapride -dofetilide -dronedarone -escitalopram -linezolid -MAOIs like Carbex, Eldepryl, Marplan, Nardil, and Parnate -methylene blue (injected into a vein) -pimozide -thioridazine -ziprasidone This medicine may also interact with the following medications: -alcohol -amphetamines -aspirin and aspirin-like medicines -carbamazepine -certain medicines for depression, anxiety, or psychotic disturbances -certain medicines for infections like chloroquine, clarithromycin, erythromycin, furazolidone, isoniazid, pentamidine -certain medicines for migraine headaches like almotriptan, eletriptan, frovatriptan, naratriptan, rizatriptan, sumatriptan, zolmitriptan -certain medicines for sleep -certain medicines that treat or prevent blood clots like dalteparin, enoxaparin, warfarin -cimetidine -diuretics -fentanyl -lithium -methadone -metoprolol -NSAIDs, medicines for pain and inflammation, like ibuprofen or naproxen -omeprazole -other medicines that prolong the QT interval (cause an abnormal heart rhythm) -procarbazine -rasagiline -supplements like St. John's wort, kava kava, valerian -tramadol -tryptophan This list may not describe all possible interactions. Give your health care provider a list of all the medicines, herbs, non-prescription drugs, or dietary supplements you use. Also tell them if you smoke, drink alcohol, or use illegal drugs. Some items may interact with your medicine. What should I watch for while using this medicine? Tell your doctor if your symptoms do not get better or if they  get worse. Visit your doctor or health care professional for regular checks on your progress. Because it may take several weeks to see the full   effects of this medicine, it is important to continue your treatment as prescribed by your doctor. Patients and their families should watch out for new or worsening thoughts of suicide or depression. Also watch out for sudden changes in feelings such as feeling anxious, agitated, panicky, irritable, hostile, aggressive, impulsive, severely restless, overly excited and hyperactive, or not being able to sleep. If this happens, especially at the beginning of treatment or after a change in dose, call your health care professional. You may get drowsy or dizzy. Do not drive, use machinery, or do anything that needs mental alertness until you know how this medicine affects you. Do not stand or sit up quickly, especially if you are an older patient. This reduces the risk of dizzy or fainting spells. Alcohol may interfere with the effect of this medicine. Avoid alcoholic drinks. Your mouth may get dry. Chewing sugarless gum or sucking hard candy, and drinking plenty of water will help. Contact your doctor if the problem does not go away or is severe. What side effects may I notice from receiving this medicine? Side effects that you should report to your doctor or health care professional as soon as possible: -allergic reactions like skin rash, itching or hives, swelling of the face, lips, or tongue -anxious -black, tarry stools -breathing problems -changes in vision -chest pain -confusion -elevated mood, decreased need for sleep, racing thoughts, impulsive behavior -eye pain -fast, irregular heartbeat -feeling faint or lightheaded, falls -feeling agitated, angry, or irritable -hallucination, loss of contact with reality -loss of balance or coordination -loss of memory -painful or prolonged erections -restlessness, pacing, inability to keep  still -seizures -stiff muscles -suicidal thoughts or other mood changes -trouble sleeping -unusual bleeding or bruising -unusually weak or tired -vomiting Side effects that usually do not require medical attention (report to your doctor or health care professional if they continue or are bothersome): -change in appetite or weight -change in sex drive or performance -dizziness -headache -increased sweating -indigestion, nausea -tremors This list may not describe all possible side effects. Call your doctor for medical advice about side effects. You may report side effects to FDA at 1-800-FDA-1088. Where should I keep my medicine? Keep out of reach of children. Store at room temperature between 15 and 30 degrees C (59 and 86 degrees F). Throw away any unused medicine after the expiration date. NOTE: This sheet is a summary. It may not cover all possible information. If you have questions about this medicine, talk to your doctor, pharmacist, or health care provider.  2018 Elsevier/Gold Standard (2015-11-14 13:18:52)  

## 2018-05-12 ENCOUNTER — Ambulatory Visit
Admission: RE | Admit: 2018-05-12 | Discharge: 2018-05-12 | Disposition: A | Discharge status: Home / Self Care | Payer: Commercial Managed Care - PPO | Source: Ambulatory Visit | Attending: Physician Assistant | Admitting: Physician Assistant

## 2018-05-12 ENCOUNTER — Ambulatory Visit: Payer: Commercial Managed Care - PPO | Admitting: Physician Assistant

## 2018-05-12 ENCOUNTER — Encounter: Payer: Self-pay | Admitting: Physician Assistant

## 2018-05-12 VITALS — BP 128/80 | HR 73 | Temp 98.3°F | Resp 16

## 2018-05-12 DIAGNOSIS — S139XXA Sprain of joints and ligaments of unspecified parts of neck, initial encounter: Secondary | ICD-10-CM | POA: Diagnosis not present

## 2018-05-12 DIAGNOSIS — M542 Cervicalgia: Secondary | ICD-10-CM | POA: Diagnosis not present

## 2018-05-12 MED ORDER — CYCLOBENZAPRINE HCL 10 MG PO TABS: 10.0000 mg | ORAL_TABLET | Freq: Three times a day (TID) | ORAL | 0 refills | Status: DC | PRN

## 2018-05-12 MED ORDER — METHYLPREDNISOLONE 4 MG PO TBPK: ORAL_TABLET | ORAL | 0 refills | Status: DC

## 2018-05-12 NOTE — Patient Instructions (Signed)
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
## 2018-05-14 ENCOUNTER — Telehealth: Payer: Self-pay

## 2018-05-14 NOTE — Telephone Encounter (Signed)
Patient advised as directed below. 

## 2018-05-14 NOTE — Telephone Encounter (Signed)
-----   Message from Mar Daring, [place] sent at 05/13/2018  2:19 PM EST ----- Cervical spine xray unremarkable and normal

## 2018-06-11 ENCOUNTER — Telehealth: Payer: Self-pay | Admitting: *Deleted

## 2018-06-11 DIAGNOSIS — F411 Generalized anxiety disorder: Secondary | ICD-10-CM

## 2018-06-11 MED ORDER — CITALOPRAM HYDROBROMIDE 20 MG PO TABS: 40.0000 mg | ORAL_TABLET | Freq: Every day | ORAL | 1 refills | Status: DC

## 2018-06-11 NOTE — Telephone Encounter (Signed)
Spoke with patient. Anxiety has been worse. Reports he is starting to date again and this has made anxiety worse. Also missed 2 days and had a full blown panic attack. Will increase citalopram to 40mg  daily. He is to call if still no improvements.

## 2018-06-11 NOTE — Telephone Encounter (Signed)
Patient called office requesting to speak to Steven Carrillo concerning his anxiety. Patient would like for Tawanna Sat to call him back at [phone].

## 2018-06-11 NOTE — Telephone Encounter (Signed)
Please review

## 2018-06-20 ENCOUNTER — Encounter: Payer: Self-pay | Admitting: Podiatry

## 2018-06-20 ENCOUNTER — Ambulatory Visit: Payer: Commercial Managed Care - PPO | Admitting: Podiatry

## 2018-06-20 DIAGNOSIS — L600 Ingrowing nail: Secondary | ICD-10-CM | POA: Diagnosis not present

## 2018-06-20 MED ORDER — GENTAMICIN SULFATE 0.1 % EX CREA: 1.0000 "application " | TOPICAL_CREAM | Freq: Two times a day (BID) | CUTANEOUS | 0 refills | Status: DC

## 2018-06-20 MED ORDER — DOXYCYCLINE HYCLATE 100 MG PO TABS: 100.0000 mg | ORAL_TABLET | Freq: Two times a day (BID) | ORAL | 0 refills | Status: DC

## 2018-06-22 NOTE — Progress Notes (Signed)
   Subjective: Patient presents today for evaluation of pain to the medial border of the left hallux that began 5-6 days ago. He reports associated redness. Patient is concerned for possible ingrown nail. Wearing shoes increases the pain. He has been applying peroxide to the area for treatment. Patient presents today for further treatment and evaluation.  Past Medical History:  Diagnosis Date  . Asthma     Objective:  General: Well developed, nourished, in no acute distress, alert and oriented x3   Dermatology: Skin is warm, dry and supple bilateral. Medial border of the left hallux appears to be erythematous with evidence of an ingrowing nail. Pain on palpation noted to the border of the nail fold. The remaining nails appear unremarkable at this time. There are no open sores, lesions.  Vascular: Dorsalis Pedis artery and Posterior Tibial artery pedal pulses palpable. No lower extremity edema noted.   Neruologic: Grossly intact via light touch bilateral.  Musculoskeletal: Muscular strength within normal limits in all groups bilateral. Normal range of motion noted to all pedal and ankle joints.   Assesement: #1 Paronychia with ingrowing nail medial border left hallux  #2 Pain in toe #3 Incurvated nail  Plan of Care:  1. Patient evaluated.  2. Discussed treatment alternatives and plan of care. Explained nail avulsion procedure and post procedure course to patient. 3. Patient opted for permanent partial nail avulsion of the medial border of the left hallux.  4. Prior to procedure, local anesthesia infiltration utilized using 3 ml of a 50:50 mixture of 2% plain lidocaine and 0.5% plain marcaine in a normal hallux block fashion and a betadine prep performed.  5. Partial permanent nail avulsion with chemical matrixectomy performed using [id number] applications of phenol followed by alcohol flush.  6. Light dressing applied. 7. Prescription for Gentamicin cream provided to patient to use daily  with a bandage.  8. Prescription for Doxycycline provided to patient.  9. Return to clinic in 2 weeks.   Edrick Kins, DPM [facility]  Dr. Edrick Kins, [address]                                        [place], [place] [address]                Office [phone]  Fax [phone]

## 2018-06-23 ENCOUNTER — Ambulatory Visit: Payer: Self-pay | Admitting: Psychiatry

## 2018-06-27 DIAGNOSIS — J301 Allergic rhinitis due to pollen: Secondary | ICD-10-CM | POA: Diagnosis not present

## 2018-06-27 DIAGNOSIS — J3089 Other allergic rhinitis: Secondary | ICD-10-CM | POA: Diagnosis not present

## 2018-07-01 DIAGNOSIS — J301 Allergic rhinitis due to pollen: Secondary | ICD-10-CM | POA: Diagnosis not present

## 2018-07-01 DIAGNOSIS — J3081 Allergic rhinitis due to animal (cat) (dog) hair and dander: Secondary | ICD-10-CM | POA: Diagnosis not present

## 2018-07-01 DIAGNOSIS — J3089 Other allergic rhinitis: Secondary | ICD-10-CM | POA: Diagnosis not present

## 2018-07-04 DIAGNOSIS — J3081 Allergic rhinitis due to animal (cat) (dog) hair and dander: Secondary | ICD-10-CM | POA: Diagnosis not present

## 2018-07-04 DIAGNOSIS — J3089 Other allergic rhinitis: Secondary | ICD-10-CM | POA: Diagnosis not present

## 2018-07-04 DIAGNOSIS — J301 Allergic rhinitis due to pollen: Secondary | ICD-10-CM | POA: Diagnosis not present

## 2018-07-08 DIAGNOSIS — J3089 Other allergic rhinitis: Secondary | ICD-10-CM | POA: Diagnosis not present

## 2018-07-08 DIAGNOSIS — J301 Allergic rhinitis due to pollen: Secondary | ICD-10-CM | POA: Diagnosis not present

## 2018-07-08 DIAGNOSIS — J3081 Allergic rhinitis due to animal (cat) (dog) hair and dander: Secondary | ICD-10-CM | POA: Diagnosis not present

## 2018-07-11 DIAGNOSIS — J301 Allergic rhinitis due to pollen: Secondary | ICD-10-CM | POA: Diagnosis not present

## 2018-07-11 DIAGNOSIS — J3089 Other allergic rhinitis: Secondary | ICD-10-CM | POA: Diagnosis not present

## 2018-07-11 DIAGNOSIS — J3081 Allergic rhinitis due to animal (cat) (dog) hair and dander: Secondary | ICD-10-CM | POA: Diagnosis not present

## 2018-07-15 ENCOUNTER — Encounter: Payer: Self-pay | Admitting: Podiatry

## 2018-07-15 ENCOUNTER — Ambulatory Visit: Payer: Commercial Managed Care - PPO | Admitting: Podiatry

## 2018-07-15 DIAGNOSIS — L600 Ingrowing nail: Secondary | ICD-10-CM | POA: Diagnosis not present

## 2018-07-18 DIAGNOSIS — J301 Allergic rhinitis due to pollen: Secondary | ICD-10-CM | POA: Diagnosis not present

## 2018-07-18 DIAGNOSIS — J3089 Other allergic rhinitis: Secondary | ICD-10-CM | POA: Diagnosis not present

## 2018-07-18 DIAGNOSIS — J3081 Allergic rhinitis due to animal (cat) (dog) hair and dander: Secondary | ICD-10-CM | POA: Diagnosis not present

## 2018-07-22 DIAGNOSIS — J3081 Allergic rhinitis due to animal (cat) (dog) hair and dander: Secondary | ICD-10-CM | POA: Diagnosis not present

## 2018-07-22 DIAGNOSIS — J301 Allergic rhinitis due to pollen: Secondary | ICD-10-CM | POA: Diagnosis not present

## 2018-07-22 DIAGNOSIS — J3089 Other allergic rhinitis: Secondary | ICD-10-CM | POA: Diagnosis not present

## 2018-07-22 NOTE — Progress Notes (Signed)
   Subjective: Patient presents today 2 weeks post ingrown nail permanent nail avulsion procedure of the medial border of the left hallux. Patient states that the toe and nail fold is feeling much better. Patient is here for further evaluation and treatment.   Past Medical History:  Diagnosis Date  . Asthma     Objective: Skin is warm, dry and supple. Nail and respective nail fold appears to be healing appropriately. Open wound to the associated nail fold with a granular wound base and moderate amount of fibrotic tissue. Minimal drainage noted. Mild erythema around the periungual region likely due to phenol chemical matricectomy.  Assessment: #1 postop permanent partial nail avulsion medial border left hallux  #2 open wound periungual nail fold of respective digit.   Plan of care: #1 patient was evaluated  #2 debridement of open wound was performed to the periungual border of the respective toe using a currette. Antibiotic ointment and Band-Aid was applied. #3 patient is to return to clinic on a PRN basis.   Edrick Kins, DPM [facility]  Dr. Edrick Kins, [address]                                        [place], [place] [address]                Office [phone]  Fax [phone]

## 2018-07-23 ENCOUNTER — Ambulatory Visit: Payer: Self-pay | Admitting: Psychiatry

## 2018-07-25 DIAGNOSIS — J3081 Allergic rhinitis due to animal (cat) (dog) hair and dander: Secondary | ICD-10-CM | POA: Diagnosis not present

## 2018-07-25 DIAGNOSIS — J3089 Other allergic rhinitis: Secondary | ICD-10-CM | POA: Diagnosis not present

## 2018-07-25 DIAGNOSIS — J301 Allergic rhinitis due to pollen: Secondary | ICD-10-CM | POA: Diagnosis not present

## 2018-07-29 DIAGNOSIS — J301 Allergic rhinitis due to pollen: Secondary | ICD-10-CM | POA: Diagnosis not present

## 2018-07-29 DIAGNOSIS — J3089 Other allergic rhinitis: Secondary | ICD-10-CM | POA: Diagnosis not present

## 2018-08-01 DIAGNOSIS — J301 Allergic rhinitis due to pollen: Secondary | ICD-10-CM | POA: Diagnosis not present

## 2018-08-01 DIAGNOSIS — J3089 Other allergic rhinitis: Secondary | ICD-10-CM | POA: Diagnosis not present

## 2018-08-01 DIAGNOSIS — J3081 Allergic rhinitis due to animal (cat) (dog) hair and dander: Secondary | ICD-10-CM | POA: Diagnosis not present

## 2018-08-05 ENCOUNTER — Encounter: Payer: Self-pay | Admitting: Psychiatry

## 2018-08-05 ENCOUNTER — Ambulatory Visit: Payer: Commercial Managed Care - PPO | Admitting: Psychiatry

## 2018-08-05 ENCOUNTER — Other Ambulatory Visit: Payer: Self-pay

## 2018-08-05 VITALS — BP 121/80 | HR 66 | Temp 98.1°F | Wt 330.7 lb

## 2018-08-05 DIAGNOSIS — F4010 Social phobia, unspecified: Secondary | ICD-10-CM

## 2018-08-05 DIAGNOSIS — J3081 Allergic rhinitis due to animal (cat) (dog) hair and dander: Secondary | ICD-10-CM | POA: Diagnosis not present

## 2018-08-05 DIAGNOSIS — F411 Generalized anxiety disorder: Secondary | ICD-10-CM

## 2018-08-05 DIAGNOSIS — F410 Panic disorder [episodic paroxysmal anxiety] without agoraphobia: Secondary | ICD-10-CM | POA: Diagnosis not present

## 2018-08-05 DIAGNOSIS — J3089 Other allergic rhinitis: Secondary | ICD-10-CM | POA: Diagnosis not present

## 2018-08-05 DIAGNOSIS — J301 Allergic rhinitis due to pollen: Secondary | ICD-10-CM | POA: Diagnosis not present

## 2018-08-05 MED ORDER — HYDROXYZINE HCL 25 MG PO TABS: 12.5000 mg | ORAL_TABLET | Freq: Every day | ORAL | 0 refills | Status: DC | PRN

## 2018-08-05 MED ORDER — CITALOPRAM HYDROBROMIDE 40 MG PO TABS: 40.0000 mg | ORAL_TABLET | Freq: Every day | ORAL | 0 refills | Status: AC
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
## 2018-08-05 NOTE — Patient Instructions (Signed)
Hydroxyzine capsules or tablets What is this medicine? HYDROXYZINE (hye DROX i zeen) is an antihistamine. This medicine is used to treat allergy symptoms. It is also used to treat anxiety and tension. This medicine can be used with other medicines to induce sleep before surgery. This medicine may be used for other purposes; ask your health care provider or pharmacist if you have questions. COMMON BRAND NAME(S): ANX, Atarax, Rezine, Vistaril What should I tell my health care provider before I take this medicine? They need to know if you have any of these conditions: -glaucoma -heart disease -history of irregular heartbeat -kidney disease -liver disease -lung or breathing disease, like asthma -stomach or intestine problems -thyroid disease -trouble passing urine -an unusual or allergic reaction to hydroxyzine, cetirizine, other medicines, foods, dyes or preservatives -pregnant or trying to get pregnant -breast-feeding How should I use this medicine? Take this medicine by mouth with a full glass of water. Follow the directions on the prescription label. You may take this medicine with food or on an empty stomach. Take your medicine at regular intervals. Do not take your medicine more often than directed. Talk to your pediatrician regarding the use of this medicine in children. Special care may be needed. While this drug may be prescribed for children as young as 6 years of age for selected conditions, precautions do apply. Patients over 65 years old may have a stronger reaction and need a smaller dose. Overdosage: If you think you have taken too much of this medicine contact a poison control center or emergency room at once. NOTE: This medicine is only for you. Do not share this medicine with others. What if I miss a dose? If you miss a dose, take it as soon as you can. If it is almost time for your next dose, take only that dose. Do not take double or extra doses. What may interact with this  medicine? Do not take this medicine with any of the following medications: -cisapride -dofetilide -dronedarone -pimozide -thioridazine This medicine may also interact with the following medications: -alcohol -antihistamines for allergy, cough, and cold -atropine -barbiturate medicines for sleep or seizures, like phenobarbital -certain antibiotics like erythromycin or clarithromycin -certain medicines for anxiety or sleep -certain medicines for bladder problems like oxybutynin, tolterodine -certain medicines for depression or psychotic disturbances -certain medicines for irregular heart beat -certain medicines for Parkinson's disease like benztropine, trihexyphenidyl -certain medicines for seizures like phenobarbital, primidone -certain medicines for stomach problems like dicyclomine, hyoscyamine -certain medicines for travel sickness like scopolamine -ipratropium -narcotic medicines for pain -other medicines that prolong the QT interval (an abnormal heart rhythm) This list may not describe all possible interactions. Give your health care provider a list of all the medicines, herbs, non-prescription drugs, or dietary supplements you use. Also tell them if you smoke, drink alcohol, or use illegal drugs. Some items may interact with your medicine. What should I watch for while using this medicine? Tell your doctor or health care professional if your symptoms do not improve. You may get drowsy or dizzy. Do not drive, use machinery, or do anything that needs mental alertness until you know how this medicine affects you. Do not stand or sit up quickly, especially if you are an older patient. This reduces the risk of dizzy or fainting spells. Alcohol may interfere with the effect of this medicine. Avoid alcoholic drinks. Your mouth may get dry. Chewing sugarless gum or sucking hard candy, and drinking plenty of water may help. Contact your doctor if   the problem does not go away or is  severe. This medicine may cause dry eyes and blurred vision. If you wear contact lenses you may feel some discomfort. Lubricating drops may help. See your eye doctor if the problem does not go away or is severe. If you are receiving skin tests for allergies, tell your doctor you are using this medicine. What side effects may I notice from receiving this medicine? Side effects that you should report to your doctor or health care professional as soon as possible: -allergic reactions like skin rash, itching or hives, swelling of the face, lips, or tongue -changes in vision -confusion -fast, irregular heartbeat -seizures -tremor -trouble passing urine or change in the amount of urine Side effects that usually do not require medical attention (report to your doctor or health care professional if they continue or are bothersome): -constipation -drowsiness -dry mouth -headache -tiredness This list may not describe all possible side effects. Call your doctor for medical advice about side effects. You may report side effects to FDA at 1-800-FDA-1088. Where should I keep my medicine? Keep out of the reach of children. Store at room temperature between 15 and 30 degrees C (59 and 86 degrees F). Keep container tightly closed. Throw away any unused medicine after the expiration date. NOTE: This sheet is a summary. It may not cover all possible information. If you have questions about this medicine, talk to your doctor, pharmacist, or health care provider.  2019 Elsevier/Gold Standard (2017-12-23 13:25:13)  

## 2018-08-06 DIAGNOSIS — G4733 Obstructive sleep apnea (adult) (pediatric): Secondary | ICD-10-CM | POA: Diagnosis not present

## 2018-08-12 DIAGNOSIS — J3081 Allergic rhinitis due to animal (cat) (dog) hair and dander: Secondary | ICD-10-CM | POA: Diagnosis not present

## 2018-08-12 DIAGNOSIS — J301 Allergic rhinitis due to pollen: Secondary | ICD-10-CM | POA: Diagnosis not present

## 2018-08-12 DIAGNOSIS — J3089 Other allergic rhinitis: Secondary | ICD-10-CM | POA: Diagnosis not present

## 2018-08-19 DIAGNOSIS — J3089 Other allergic rhinitis: Secondary | ICD-10-CM | POA: Diagnosis not present

## 2018-08-19 DIAGNOSIS — J3081 Allergic rhinitis due to animal (cat) (dog) hair and dander: Secondary | ICD-10-CM | POA: Diagnosis not present

## 2018-08-19 DIAGNOSIS — J301 Allergic rhinitis due to pollen: Secondary | ICD-10-CM | POA: Diagnosis not present

## 2018-08-26 DIAGNOSIS — J3089 Other allergic rhinitis: Secondary | ICD-10-CM | POA: Diagnosis not present

## 2018-08-26 DIAGNOSIS — J301 Allergic rhinitis due to pollen: Secondary | ICD-10-CM | POA: Diagnosis not present

## 2018-08-26 DIAGNOSIS — J3081 Allergic rhinitis due to animal (cat) (dog) hair and dander: Secondary | ICD-10-CM | POA: Diagnosis not present

## 2018-08-29 ENCOUNTER — Ambulatory Visit: Payer: Commercial Managed Care - PPO | Admitting: Licensed Clinical Social Worker

## 2018-09-02 ENCOUNTER — Ambulatory Visit (INDEPENDENT_AMBULATORY_CARE_PROVIDER_SITE_OTHER): Payer: Commercial Managed Care - PPO | Admitting: Psychiatry

## 2018-09-02 ENCOUNTER — Other Ambulatory Visit: Payer: Self-pay

## 2018-09-02 ENCOUNTER — Encounter: Payer: Self-pay | Admitting: Psychiatry

## 2018-09-02 VITALS — BP 125/80 | HR 81 | Temp 98.0°F | Wt 328.2 lb

## 2018-09-02 DIAGNOSIS — F410 Panic disorder [episodic paroxysmal anxiety] without agoraphobia: Secondary | ICD-10-CM | POA: Diagnosis not present

## 2018-09-02 DIAGNOSIS — F411 Generalized anxiety disorder: Secondary | ICD-10-CM

## 2018-09-02 DIAGNOSIS — F4010 Social phobia, unspecified: Secondary | ICD-10-CM | POA: Diagnosis not present

## 2018-09-02 NOTE — Progress Notes (Signed)
[facility] MD OP Progress Note  09/02/2018 5:23 PM RAJAN BURGARD  MRN:  [id number]  Chief Complaint: ' I am here for follow up." Chief Complaint    Follow-up; Medication Refill     HPI: Chrsitopher is a 33 year old Caucasian male, lives in [place], employed, has a history of anxiety disorder, asthma, presented to clinic today for a follow-up visit.  Patient today reports he has relocated to his new home.  He reports he feels good that he is no longer with roommates who can be frustrating to get along with.  Patient however reports there are times however he feels lonely.  He reports he is planning to share his house with a friend who may be able to join him soon and get part of the rent.  Patient reports he is buying this new home from his brother and has to pay the mortgage.  Patient reports work is going well however he is not happy with the fact that he was not given the promotion yet.  He reports he wants to wait until the end of April to see if it will happen or he will start looking for a new job.  He reports he has not had any significant panic attacks since his last visit with Probation officer.  Patient reports he continues to be compliant with his Celexa.  He denies any side effects.  He reports he had to reschedule his therapy visit since he had a situation at work.  He is scheduled to see this therapist end of this month.  Patient denies any suicidality, homicidality or perceptual disturbances.   Visit Diagnosis:    ICD-10-CM   1. GAD (generalized anxiety disorder) F41.1   2. Panic attacks F41.0   3. Social anxiety disorder F40.10     Past Psychiatric History: I have reviewed past psychiatric history from my progress note on 08/05/2018.  Past trials of Celexa.  Past Medical History:  Past Medical History:  Diagnosis Date  . ADHD (attention deficit hyperactivity disorder)   . Asthma     Past Surgical History:  Procedure Laterality Date  . ESOPHAGOGASTRODUODENOSCOPY (EGD) WITH PROPOFOL N/A  05/03/2017   Procedure: ESOPHAGOGASTRODUODENOSCOPY (EGD) WITH PROPOFOL;  Surgeon: [name], MD;  Location: [facility] ENDOSCOPY;  Service: Gastroenterology;  Laterality: N/A;  . FOREIGN BODY REMOVAL N/A 05/03/2017   Procedure: FOREIGN BODY REMOVAL;  Surgeon: [name], MD;  Location: [facility] ENDOSCOPY;  Service: Gastroenterology;  Laterality: N/A;  . MANDIBLE SURGERY    . TOE SURGERY      Family Psychiatric History: I have reviewed family psychiatric history from my progress note on 08/05/2018.  Family History:  Family History  Problem Relation Age of Onset  . Alcohol abuse Father   . Drug abuse Paternal Uncle     Social History: Reviewed social history from my progress note on 08/05/2018. Social History   Socioeconomic History  . Marital status: Single    Spouse name: Not on file  . Number of children: 0  . Years of education: Not on file  . Highest education level: High school graduate  Occupational History  . Not on file  Social Needs  . Financial resource strain: Not hard at all  . Food insecurity:    Worry: Never true    Inability: Never true  . Transportation needs:    Medical: No    Non-medical: No  Tobacco Use  . Smoking status: Never Smoker  . Smokeless tobacco: Never Used  Substance and Sexual Activity  .  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 Drug use: No  . Sexual activity: Yes  Lifestyle  . Physical activity:    Days per week: 0 days    Minutes per session: 0 min  . Stress: Only a little  Relationships  . Social connections:    Talks on phone: Not on file    Gets together: Not on file    Attends religious service: Never    Active member of club or organization: No    Attends meetings of clubs or organizations: Never    Relationship status: Never married  Other Topics Concern  . Not on file  Social History Narrative  . Not on file    Allergies: No Known Allergies  Metabolic Disorder Labs: No results found for: HGBA1C, MPG No  results found for: PROLACTIN No results found for: CHOL, TRIG, HDL, CHOLHDL, VLDL, LDLCALC No results found for: TSH  Therapeutic Level Labs: No results found for: LITHIUM No results found for: VALPROATE No components found for:  CBMZ  Current Medications: Current Outpatient Medications  Medication Sig Dispense Refill  . albuterol (PROVENTIL HFA) 108 (90 Base) MCG/ACT inhaler Inhale 1-2 puffs into the lungs every 6 (six) hours as needed for wheezing or shortness of breath. 1 Inhaler 3  . azelastine (ASTELIN) 0.1 % nasal spray   3  . azelastine (OPTIVAR) 0.05 % ophthalmic solution Place 1 drop into both eyes 2 (two) times daily. 6 mL 12  . Cetirizine HCl (ZYRTEC ALLERGY) 10 MG CAPS Take by mouth.    . citalopram (CELEXA) 40 MG tablet Take 1 tablet (40 mg total) by mouth daily. 90 tablet 0  . EPINEPHrine 0.3 mg/0.3 mL IJ SOAJ injection USE AS DIRECTED AS NEEDED  1  . fluorometholone (FML) 0.1 % ophthalmic suspension INSTILL 1 DROP INTO EACH EYE 4 TIMES DAILY AS DIRECTED. SHAKE WELL  0  . hydrOXYzine (ATARAX/VISTARIL) 25 MG tablet Take 0.5-1 tablets (12.5-25 mg total) by mouth daily as needed for anxiety. 90 tablet 0  . omeprazole (PRILOSEC) 40 MG capsule Take 1 capsule (40 mg total) by mouth daily. 90 capsule 3   No current facility-administered medications for this visit.      Musculoskeletal: Strength & Muscle Tone: within normal limits Gait & Station: normal Patient leans: N/A  Psychiatric Specialty Exam: Review of Systems  Psychiatric/Behavioral: The patient is nervous/anxious.   All other systems reviewed and are negative.   Blood pressure 125/80, pulse 81, temperature 98 F (36.7 C), temperature source Oral, weight (!) 328 lb 3.2 oz (148.9 kg).Body mass index is 43.3 kg/m.  General Appearance: Casual  Eye Contact:  Fair  Speech:  Normal Rate  Volume:  Normal  Mood:  Anxious  Affect:  Appropriate  Thought Process:  Goal Directed and Descriptions of Associations: Intact   Orientation:  Full (Time, Place, and Person)  Thought Content: Logical   Suicidal Thoughts:  No  Homicidal Thoughts:  No  Memory:  Immediate;   Fair Recent;   Fair Remote;   Fair  Judgement:  Fair  Insight:  Fair  Psychomotor Activity:  Normal  Concentration:  Concentration: Fair and Attention Span: Fair  Recall:  [employer] of Knowledge: Fair  Language: Fair  Akathisia:  No  Handed:  Right  AIMS (if indicated): Denies tremors, rigidity  Assets:  Communication Skills Desire for Improvement Housing  ADL's:  Intact  Cognition: WNL  Sleep:  Fair   Screenings: GAD-7  Office Visit from 03/24/2018 in [facility] Visit from 01/06/2018 in [facility]  Total GAD-7 Score  14  14    PHQ2-9     Office Visit from 09/04/2017 in [facility]  PHQ-2 Total Score  1  PHQ-9 Total Score  5       Assessment and Plan: Stormy is a 33 year old Caucasian male, employed, single, lives in [place], has a history of anxiety disorder, asthma, presented to clinic today for a follow-up visit.  Patient is biologically predisposed given his history of trauma as well as family history of mental health problems.  Patient however is currently making progress on the current medication regimen.  Plan as noted below.  Plan GAD-improving Celexa 40 mg p.o. daily Hydroxyzine 25 mg p.o. daily PRN for severe anxiety attacks Patient will start psychotherapy session soon.  For panic attacks-improving Hydroxyzine as needed.  For social anxiety disorder-improving Patient will start psychotherapy sessions.  Pending labs-TSH  Follow-up in clinic in 1 month or sooner if needed.  I have spent atleast 15 minutes face to face with patient today. More than 50 % of the time was spent for psychoeducation and supportive psychotherapy and care coordination.  This note was generated in part or whole with voice recognition software. Voice recognition is usually quite  accurate but there are transcription errors that can and very often do occur. I apologize for any typographical errors that were not detected and corrected.        Ursula Alert, MD 09/02/2018, 5:23 PM

## 2018-09-03 ENCOUNTER — Other Ambulatory Visit: Payer: Self-pay

## 2018-09-03 ENCOUNTER — Ambulatory Visit (INDEPENDENT_AMBULATORY_CARE_PROVIDER_SITE_OTHER): Payer: Commercial Managed Care - PPO | Admitting: Physician Assistant

## 2018-09-03 ENCOUNTER — Encounter: Payer: Self-pay | Admitting: Physician Assistant

## 2018-09-03 VITALS — BP 120/78 | HR 77 | Temp 97.9°F | Ht 73.0 in | Wt 330.5 lb

## 2018-09-03 DIAGNOSIS — F411 Generalized anxiety disorder: Secondary | ICD-10-CM

## 2018-09-03 DIAGNOSIS — Z6841 Body Mass Index (BMI) 40.0 and over, adult: Secondary | ICD-10-CM

## 2018-09-03 DIAGNOSIS — Z0000 Encounter for general adult medical examination without abnormal findings: Secondary | ICD-10-CM | POA: Diagnosis not present

## 2018-09-03 DIAGNOSIS — Z23 Encounter for immunization: Secondary | ICD-10-CM | POA: Diagnosis not present

## 2018-09-03 DIAGNOSIS — Z114 Encounter for screening for human immunodeficiency virus [HIV]: Secondary | ICD-10-CM

## 2018-09-03 NOTE — Progress Notes (Signed)
Patient: Steven Carrillo, Male    DOB: Dec 25, 1985, 33 y.o.   MRN: [id number] Visit Date: 09/03/2018  Today's Provider: Mar Daring, PA-C   Chief Complaint  Patient presents with  . Annual Exam   Subjective:     Annual physical exam [name] is a 33 y.o. male who presents today for health maintenance and complete physical. He feels fairly well. He reports exercising just at work. He reports he is sleeping fairly well, pt uses a Cpap machine. -----------------------------------------------------------------   Review of Systems  Constitutional: Positive for fatigue.  HENT: Positive for sinus pressure.   Eyes: Positive for photophobia.  Respiratory: Positive for apnea.   Cardiovascular: Negative.   Gastrointestinal: Positive for abdominal pain.  Endocrine: Negative.   Genitourinary: Negative.   Musculoskeletal: Positive for back pain, myalgias, neck pain and neck stiffness.  Skin: Negative.   Allergic/Immunologic: Positive for environmental allergies and food allergies.  Neurological: Positive for light-headedness and headaches.  Hematological: Negative.   Psychiatric/Behavioral: Positive for agitation and decreased concentration. The patient is nervous/anxious and is hyperactive.     Social History      He  reports that he has never smoked. He has never used smokeless tobacco. He reports previous alcohol use. He reports that he does not use drugs.       Social History   Socioeconomic History  . Marital status: Single    Spouse name: Not on file  . Number of children: 0  . Years of education: Not on file  . Highest education level: High school graduate  Occupational History  . Not on file  Social Needs  . Financial resource strain: Not hard at all  . Food insecurity:    Worry: Never true    Inability: Never true  . Transportation needs:    Medical: No    Non-medical: No  Tobacco Use  . Smoking status: Never Smoker  . Smokeless tobacco: Never  Used  Substance and Sexual Activity  . 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 Drug use: No  . Sexual activity: Yes  Lifestyle  . Physical activity:    Days per week: 0 days    Minutes per session: 0 min  . Stress: Only a little  Relationships  . Social connections:    Talks on phone: Not on file    Gets together: Not on file    Attends religious service: Never    Active member of club or organization: No    Attends meetings of clubs or organizations: Never    Relationship status: Never married  Other Topics Concern  . Not on file  Social History Narrative  . Not on file    Past Medical History:  Diagnosis Date  . ADHD (attention deficit hyperactivity disorder)   . Asthma   . Sleep apnea      Patient Active Problem List   Diagnosis Date Noted  . GAD (generalized anxiety disorder) 01/09/2018  . Eosinophilic esophagitis due to food 01/09/2018  . Non-seasonal allergic rhinitis due to pollen 01/09/2018  . Allergic conjunctivitis of both eyes 01/09/2018  . Asthma, intermittent 05/07/2017  . Class 3 severe obesity due to excess calories with serious comorbidity and body mass index (BMI) of 40.0 to 44.9 in adult [facility]) 05/07/2017    Past Surgical History:  Procedure Laterality Date  . ESOPHAGOGASTRODUODENOSCOPY (EGD) WITH PROPOFOL N/A 05/03/2017   Procedure: ESOPHAGOGASTRODUODENOSCOPY (EGD) WITH PROPOFOL;  Surgeon: [name], MD;  Location: [facility] ENDOSCOPY;  Service: Gastroenterology;  Laterality: N/A;  . FOREIGN BODY REMOVAL N/A 05/03/2017   Procedure: FOREIGN BODY REMOVAL;  Surgeon: [name], MD;  Location: [facility] ENDOSCOPY;  Service: Gastroenterology;  Laterality: N/A;  . MANDIBLE SURGERY    . TOE SURGERY      Family History        Family Status  Relation Name Status  . Mother  Alive  . Father  Alive  . Sister  Alive  . Brother  Alive  . Annamarie Major  (Not Specified)  . Sister  Alive  . Sister  Alive  . Brother  Alive  . Brother  Alive         His family history includes Alcohol abuse in his father; Drug abuse in his paternal uncle.      No Known Allergies   Current Outpatient Medications:  .  albuterol (PROVENTIL HFA) 108 (90 Base) MCG/ACT inhaler, Inhale 1-2 puffs into the lungs every 6 (six) hours as needed for wheezing or shortness of breath., Disp: 1 Inhaler, Rfl: 3 .  azelastine (ASTELIN) 0.1 % nasal spray, , Disp: , Rfl: 3 .  azelastine (OPTIVAR) 0.05 % ophthalmic solution, Place 1 drop into both eyes 2 (two) times daily., Disp: 6 mL, Rfl: 12 .  Cetirizine HCl (ZYRTEC ALLERGY) 10 MG CAPS, Take by mouth., Disp: , Rfl:  .  citalopram (CELEXA) 40 MG tablet, Take 1 tablet (40 mg total) by mouth daily., Disp: 90 tablet, Rfl: 0 .  EPINEPHrine 0.3 mg/0.3 mL IJ SOAJ injection, USE AS DIRECTED AS NEEDED, Disp: , Rfl: 1 .  fluorometholone (FML) 0.1 % ophthalmic suspension, INSTILL 1 DROP INTO EACH EYE 4 TIMES DAILY AS DIRECTED. SHAKE WELL, Disp: , Rfl: 0 .  hydrOXYzine (ATARAX/VISTARIL) 25 MG tablet, Take 0.5-1 tablets (12.5-25 mg total) by mouth daily as needed for anxiety., Disp: 90 tablet, Rfl: 0 .  omeprazole (PRILOSEC) 40 MG capsule, Take 1 capsule (40 mg total) by mouth daily., Disp: 90 capsule, Rfl: 3   Patient Care Team: Mar Daring, PA-C as PCP - General (Family Medicine)    Objective:    Vitals: BP 120/78 (BP Location: Left Arm, Patient Position: Sitting, Cuff Size: Large)   Pulse 77   Temp 97.9 F (36.6 C) (Oral)   Ht 6\' 1"  (1.854 m)   Wt (!) 330 lb 8 oz (149.9 kg)   SpO2 97%   BMI 43.60 kg/m    Vitals:   09/03/18 1515  BP: 120/78  Pulse: 77  Temp: 97.9 F (36.6 C)  TempSrc: Oral  SpO2: 97%  Weight: (!) 330 lb 8 oz (149.9 kg)  Height: 6\' 1"  (1.854 m)     Physical Exam Constitutional:      General: He is not in acute distress.    Appearance: Normal appearance. He is well-developed. He is obese. He is not ill-appearing.  HENT:     Head: Normocephalic and atraumatic.     Right Ear:  Tympanic membrane, ear canal and external ear normal.     Left Ear: Tympanic membrane, ear canal and external ear normal.     Nose: Nose normal.     Mouth/Throat:     Mouth: Mucous membranes are moist.     Pharynx: No posterior oropharyngeal erythema.  Eyes:     General:        Right eye: No discharge.     Conjunctiva/sclera: Conjunctivae normal.     Pupils: Pupils  are equal, round, and reactive to light.  Neck:     Musculoskeletal: Normal range of motion and neck supple.     Thyroid: No thyromegaly.     Trachea: No tracheal deviation.  Cardiovascular:     Rate and Rhythm: Normal rate and regular rhythm.     Heart sounds: Normal heart sounds. No murmur.  Pulmonary:     Effort: Pulmonary effort is normal. No respiratory distress.     Breath sounds: Normal breath sounds. No wheezing or rales.  Chest:     Chest wall: No tenderness.  Abdominal:     General: Abdomen is protuberant. There is no distension.     Palpations: Abdomen is soft. There is no mass.     Tenderness: There is no abdominal tenderness. There is no guarding or rebound.  Musculoskeletal: Normal range of motion.        General: No tenderness.  Lymphadenopathy:     Cervical: No cervical adenopathy.  Skin:    General: Skin is warm and dry.     Capillary Refill: Capillary refill takes less than 2 seconds.     Findings: No erythema or rash.  Neurological:     General: No focal deficit present.     Mental Status: He is alert and oriented to person, place, and time. Mental status is at baseline.     Cranial Nerves: No cranial nerve deficit.     Motor: No abnormal muscle tone.     Coordination: Coordination normal.     Deep Tendon Reflexes: Reflexes are normal and symmetric. Reflexes normal.  Psychiatric:        Behavior: Behavior normal.        Thought Content: Thought content normal.        Judgment: Judgment normal.      Depression Screen PHQ 2/9 Scores 09/03/2018 09/04/2017  PHQ - 2 Score 2 1  PHQ- 9 Score 9  5       Assessment & Plan:     Routine Health Maintenance and Physical Exam  Exercise Activities and Dietary recommendations Goals   None      There is no immunization history on file for this patient.  Health Maintenance  Topic Date Due  . HIV Screening  01/30/2001  . TETANUS/TDAP  01/30/2005  . INFLUENZA VACCINE  09/23/2018 (Originally 01/23/2018)     Discussed health benefits of physical activity, and encouraged him to engage in regular exercise appropriate for his age and condition.    1. Annual physical exam Normal physical exam today. Will check labs as below and f/u pending lab results. If labs are stable and WNL she will not need to have these rechecked for one year at her next annual physical exam. She is to call the office in the meantime if she has any acute issue, questions or concerns. - CBC w/Diff/Platelet - Comprehensive Metabolic Panel (CMET) - TSH - Lipid Profile - HgB A1c  2. Class 3 severe obesity due to excess calories with serious comorbidity and body mass index (BMI) of 40.0 to 44.9 in adult [facility]) Counseled patient on healthy lifestyle modifications including dieting and exercise.  - Comprehensive Metabolic Panel (CMET) - Lipid Profile - HgB A1c  3. GAD (generalized anxiety disorder) Stable on Citalopram 40mg .   4. Need for Tdap vaccination Tdap Vaccine given to patient without complications. Patient sat for 15 minutes after administration and was tolerated well without adverse effects. - Tdap vaccine greater than or equal to 7yo IM  5.  Screening for HIV without presence of risk factors Will check labs as below and f/u pending results. - HIV antibody (with reflex)  --------------------------------------------------------------------    Mar Daring, PA-C  [facility]

## 2018-09-03 NOTE — Patient Instructions (Signed)

## 2018-09-04 LAB — CBC WITH DIFFERENTIAL/PLATELET
Basophils Absolute: 0.1 10*3/uL (ref 0.0–0.2)
Basos: 1 %
EOS (ABSOLUTE): 1.9 10*3/uL — ABNORMAL HIGH (ref 0.0–0.4)
Eos: 20 %
Hematocrit: 45.8 % (ref 37.5–51.0)
Hemoglobin: 15.9 g/dL (ref 13.0–17.7)
Immature Grans (Abs): 0.1 10*3/uL (ref 0.0–0.1)
Immature Granulocytes: 1 %
Lymphocytes Absolute: 2.1 10*3/uL (ref 0.7–3.1)
Lymphs: 21 %
MCH: 30.2 pg (ref 26.6–33.0)
MCHC: 34.7 g/dL (ref 31.5–35.7)
MCV: 87 fL (ref 79–97)
Monocytes Absolute: 0.6 10*3/uL (ref 0.1–0.9)
Monocytes: 7 %
Neutrophils Absolute: 4.9 10*3/uL (ref 1.4–7.0)
Neutrophils: 50 %
Platelets: 231 10*3/uL (ref 150–450)
RBC: 5.27 x10E6/uL (ref 4.14–5.80)
RDW: 12.2 % (ref 11.6–15.4)
WBC: 9.7 10*3/uL (ref 3.4–10.8)

## 2018-09-04 LAB — COMPREHENSIVE METABOLIC PANEL
ALT: 25 IU/L (ref 0–44)
AST: 16 IU/L (ref 0–40)
Albumin/Globulin Ratio: 1.5 (ref 1.2–2.2)
Albumin: 4.3 g/dL (ref 4.0–5.0)
Alkaline Phosphatase: 85 IU/L (ref 39–117)
BUN/Creatinine Ratio: 18 (ref 9–20)
BUN: 17 mg/dL (ref 6–20)
Bilirubin Total: 0.3 mg/dL (ref 0.0–1.2)
CO2: 23 mmol/L (ref 20–29)
Calcium: 8.9 mg/dL (ref 8.7–10.2)
Chloride: 101 mmol/L (ref 96–106)
Creatinine, Ser: 0.95 mg/dL (ref 0.76–1.27)
GFR calc Af Amer: 122 mL/min/{1.73_m2} (ref >59)
GFR calc non Af Amer: 105 mL/min/{1.73_m2} (ref >59)
Globulin, Total: 2.9 g/dL (ref 1.5–4.5)
Glucose: 79 mg/dL (ref 65–99)
Potassium: 4 mmol/L (ref 3.5–5.2)
Sodium: 140 mmol/L (ref 134–144)
Total Protein: 7.2 g/dL (ref 6.0–8.5)

## 2018-09-04 LAB — LIPID PANEL
Chol/HDL Ratio: 7.2 ratio — ABNORMAL HIGH (ref 0.0–5.0)
Cholesterol, Total: 215 mg/dL — ABNORMAL HIGH (ref 100–199)
HDL: 30 mg/dL — ABNORMAL LOW (ref >39)
LDL Calculated: 136 mg/dL — ABNORMAL HIGH (ref 0–99)
Triglycerides: 244 mg/dL — ABNORMAL HIGH (ref 0–149)
VLDL Cholesterol Cal: 49 mg/dL — ABNORMAL HIGH (ref 5–40)

## 2018-09-04 LAB — HIV ANTIBODY (ROUTINE TESTING W REFLEX): HIV Screen 4th Generation wRfx: NONREACTIVE

## 2018-09-04 LAB — HEMOGLOBIN A1C
Est. average glucose Bld gHb Est-mCnc: 117 mg/dL
Hgb A1c MFr Bld: 5.7 % — ABNORMAL HIGH (ref 4.8–5.6)

## 2018-09-04 LAB — TSH: TSH: 0.847 u[IU]/mL (ref 0.450–4.500)

## 2018-09-09 DIAGNOSIS — J3089 Other allergic rhinitis: Secondary | ICD-10-CM | POA: Diagnosis not present

## 2018-09-09 DIAGNOSIS — J301 Allergic rhinitis due to pollen: Secondary | ICD-10-CM | POA: Diagnosis not present

## 2018-09-09 DIAGNOSIS — J3081 Allergic rhinitis due to animal (cat) (dog) hair and dander: Secondary | ICD-10-CM | POA: Diagnosis not present

## 2018-09-12 ENCOUNTER — Encounter: Payer: Self-pay | Admitting: Physician Assistant

## 2018-09-12 ENCOUNTER — Other Ambulatory Visit: Payer: Self-pay

## 2018-09-12 ENCOUNTER — Ambulatory Visit: Payer: Commercial Managed Care - PPO | Admitting: Physician Assistant

## 2018-09-12 VITALS — BP 123/78 | HR 72 | Temp 98.3°F | Resp 16 | Wt 334.0 lb

## 2018-09-12 DIAGNOSIS — B999 Unspecified infectious disease: Secondary | ICD-10-CM | POA: Diagnosis not present

## 2018-09-12 MED ORDER — HYDROGEN PEROXIDE 3 % EX SOLN: CUTANEOUS | 0 refills | Status: DC | PRN

## 2018-09-12 MED ORDER — DOXYCYCLINE HYCLATE 100 MG PO TABS: 100.0000 mg | ORAL_TABLET | Freq: Two times a day (BID) | ORAL | 0 refills | Status: DC

## 2018-09-12 NOTE — Progress Notes (Signed)
Patient: Steven Carrillo Male    DOB: 11-05-1985   33 y.o.   MRN: [id number] Visit Date: 09/12/2018  Today's Provider: Mar Daring, PA-C   No chief complaint on file.  Subjective:     HPI Patient here today c/o belly button infection. Patient reports he has had this problem in the past. Patient reports bloody discharge, denies any fever. Patient reports that he has been using peroxide to clean area.   No Known Allergies   Current Outpatient Medications:  .  albuterol (PROVENTIL HFA) 108 (90 Base) MCG/ACT inhaler, Inhale 1-2 puffs into the lungs every 6 (six) hours as needed for wheezing or shortness of breath., Disp: 1 Inhaler, Rfl: 3 .  azelastine (ASTELIN) 0.1 % nasal spray, , Disp: , Rfl: 3 .  azelastine (OPTIVAR) 0.05 % ophthalmic solution, Place 1 drop into both eyes 2 (two) times daily., Disp: 6 mL, Rfl: 12 .  Cetirizine HCl (ZYRTEC ALLERGY) 10 MG CAPS, Take by mouth., Disp: , Rfl:  .  citalopram (CELEXA) 40 MG tablet, Take 1 tablet (40 mg total) by mouth daily., Disp: 90 tablet, Rfl: 0 .  EPINEPHrine 0.3 mg/0.3 mL IJ SOAJ injection, USE AS DIRECTED AS NEEDED, Disp: , Rfl: 1 .  fluorometholone (FML) 0.1 % ophthalmic suspension, INSTILL 1 DROP INTO EACH EYE 4 TIMES DAILY AS DIRECTED. SHAKE WELL, Disp: , Rfl: 0 .  hydrOXYzine (ATARAX/VISTARIL) 25 MG tablet, Take 0.5-1 tablets (12.5-25 mg total) by mouth daily as needed for anxiety., Disp: 90 tablet, Rfl: 0 .  omeprazole (PRILOSEC) 40 MG capsule, Take 1 capsule (40 mg total) by mouth daily., Disp: 90 capsule, Rfl: 3  Review of Systems  Constitutional: Negative.   Respiratory: Negative.   Cardiovascular: Negative.   Gastrointestinal: Negative.   Skin: Positive for wound.    Social History   Tobacco Use  . Smoking status: Never Smoker  . Smokeless tobacco: Never Used  Substance Use Topics  . 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  Constitutional:      General: He is not in acute distress.    Appearance: Normal appearance. He is well-developed. He is obese. He is not ill-appearing or diaphoretic.  HENT:     Head: Normocephalic and atraumatic.  Neck:     Musculoskeletal: Normal range of motion and neck supple.  Cardiovascular:     Rate and Rhythm: Normal rate and regular rhythm.     Heart sounds: Normal heart sounds. No murmur. No friction rub. No gallop.   Pulmonary:     Effort: Pulmonary effort is normal. No respiratory distress.     Breath sounds: Normal breath sounds. No wheezing or rales.  Abdominal:     General: Abdomen is protuberant. Bowel sounds are normal.     Palpations: Abdomen is soft.     Tenderness: There is no abdominal tenderness.    Neurological:     Mental Status: He is alert.         Assessment & Plan    1. Infection Noted of the umbilicus. Will treat with doxycycline as below to cover MRSA. Also hydrogen peroxide prescribed for cleanings. Call if no improvements or symptoms worsen.  - doxycycline (VIBRA-TABS) 100 MG  tablet; Take 1 tablet (100 mg total) by mouth 2 (two) times daily.  Dispense: 20 tablet; Refill: 0 - hydrogen peroxide 3 % external solution; Apply topically as needed.  Dispense: 120 mL; Refill: 0     Mar Daring, PA-C  [facility]

## 2018-09-16 DIAGNOSIS — J301 Allergic rhinitis due to pollen: Secondary | ICD-10-CM | POA: Diagnosis not present

## 2018-09-16 DIAGNOSIS — J3081 Allergic rhinitis due to animal (cat) (dog) hair and dander: Secondary | ICD-10-CM | POA: Diagnosis not present

## 2018-09-16 DIAGNOSIS — J3089 Other allergic rhinitis: Secondary | ICD-10-CM | POA: Diagnosis not present

## 2018-09-17 ENCOUNTER — Encounter: Payer: Self-pay | Admitting: Physician Assistant

## 2018-09-19 DIAGNOSIS — J301 Allergic rhinitis due to pollen: Secondary | ICD-10-CM | POA: Diagnosis not present

## 2018-09-19 DIAGNOSIS — J3089 Other allergic rhinitis: Secondary | ICD-10-CM | POA: Diagnosis not present

## 2018-09-19 DIAGNOSIS — J3081 Allergic rhinitis due to animal (cat) (dog) hair and dander: Secondary | ICD-10-CM | POA: Diagnosis not present

## 2018-09-23 ENCOUNTER — Ambulatory Visit: Payer: Commercial Managed Care - PPO | Admitting: Licensed Clinical Social Worker

## 2018-09-30 DIAGNOSIS — J301 Allergic rhinitis due to pollen: Secondary | ICD-10-CM | POA: Diagnosis not present

## 2018-09-30 DIAGNOSIS — J3089 Other allergic rhinitis: Secondary | ICD-10-CM | POA: Diagnosis not present

## 2018-09-30 DIAGNOSIS — J3081 Allergic rhinitis due to animal (cat) (dog) hair and dander: Secondary | ICD-10-CM | POA: Diagnosis not present

## 2018-10-02 DIAGNOSIS — J3081 Allergic rhinitis due to animal (cat) (dog) hair and dander: Secondary | ICD-10-CM | POA: Diagnosis not present

## 2018-10-02 DIAGNOSIS — J301 Allergic rhinitis due to pollen: Secondary | ICD-10-CM | POA: Diagnosis not present

## 2018-10-02 DIAGNOSIS — J3089 Other allergic rhinitis: Secondary | ICD-10-CM | POA: Diagnosis not present

## 2018-10-07 ENCOUNTER — Ambulatory Visit: Payer: Commercial Managed Care - PPO | Admitting: Licensed Clinical Social Worker

## 2018-10-09 DIAGNOSIS — J301 Allergic rhinitis due to pollen: Secondary | ICD-10-CM | POA: Diagnosis not present

## 2018-10-09 DIAGNOSIS — J3081 Allergic rhinitis due to animal (cat) (dog) hair and dander: Secondary | ICD-10-CM | POA: Diagnosis not present

## 2018-10-09 DIAGNOSIS — J3089 Other allergic rhinitis: Secondary | ICD-10-CM | POA: Diagnosis not present

## 2018-10-14 DIAGNOSIS — J3089 Other allergic rhinitis: Secondary | ICD-10-CM | POA: Diagnosis not present

## 2018-10-14 DIAGNOSIS — J301 Allergic rhinitis due to pollen: Secondary | ICD-10-CM | POA: Diagnosis not present

## 2018-10-14 DIAGNOSIS — J3081 Allergic rhinitis due to animal (cat) (dog) hair and dander: Secondary | ICD-10-CM | POA: Diagnosis not present

## 2018-10-16 DIAGNOSIS — J4520 Mild intermittent asthma, uncomplicated: Secondary | ICD-10-CM | POA: Diagnosis not present

## 2018-10-16 DIAGNOSIS — J301 Allergic rhinitis due to pollen: Secondary | ICD-10-CM | POA: Diagnosis not present

## 2018-10-16 DIAGNOSIS — J3089 Other allergic rhinitis: Secondary | ICD-10-CM | POA: Diagnosis not present

## 2018-10-21 DIAGNOSIS — J3081 Allergic rhinitis due to animal (cat) (dog) hair and dander: Secondary | ICD-10-CM | POA: Diagnosis not present

## 2018-10-21 DIAGNOSIS — J301 Allergic rhinitis due to pollen: Secondary | ICD-10-CM | POA: Diagnosis not present

## 2018-10-21 DIAGNOSIS — J3089 Other allergic rhinitis: Secondary | ICD-10-CM | POA: Diagnosis not present

## 2018-10-28 DIAGNOSIS — J3089 Other allergic rhinitis: Secondary | ICD-10-CM | POA: Diagnosis not present

## 2018-10-28 DIAGNOSIS — J301 Allergic rhinitis due to pollen: Secondary | ICD-10-CM | POA: Diagnosis not present

## 2018-10-28 DIAGNOSIS — J3081 Allergic rhinitis due to animal (cat) (dog) hair and dander: Secondary | ICD-10-CM | POA: Diagnosis not present

## 2018-10-30 DIAGNOSIS — J3081 Allergic rhinitis due to animal (cat) (dog) hair and dander: Secondary | ICD-10-CM | POA: Diagnosis not present

## 2018-10-30 DIAGNOSIS — J301 Allergic rhinitis due to pollen: Secondary | ICD-10-CM | POA: Diagnosis not present

## 2018-10-30 DIAGNOSIS — J3089 Other allergic rhinitis: Secondary | ICD-10-CM | POA: Diagnosis not present

## 2018-11-11 ENCOUNTER — Encounter: Payer: Self-pay | Admitting: Emergency Medicine

## 2018-11-11 ENCOUNTER — Emergency Department
Admission: EM | Admit: 2018-11-11 | Discharge: 2018-11-11 | Disposition: A | Discharge status: Home / Self Care | Payer: Commercial Managed Care - PPO | Attending: Emergency Medicine | Admitting: Emergency Medicine

## 2018-11-11 ENCOUNTER — Other Ambulatory Visit: Payer: Self-pay

## 2018-11-11 DIAGNOSIS — T7840XA Allergy, unspecified, initial encounter: Secondary | ICD-10-CM | POA: Diagnosis not present

## 2018-11-11 DIAGNOSIS — Z79899 Other long term (current) drug therapy: Secondary | ICD-10-CM | POA: Diagnosis not present

## 2018-11-11 DIAGNOSIS — F909 Attention-deficit hyperactivity disorder, unspecified type: Secondary | ICD-10-CM | POA: Insufficient documentation

## 2018-11-11 DIAGNOSIS — J45909 Unspecified asthma, uncomplicated: Secondary | ICD-10-CM | POA: Diagnosis not present

## 2018-11-11 HISTORY — DX: Allergy, unspecified, initial encounter: T78.40XA

## 2018-11-11 MED ORDER — DIPHENHYDRAMINE HCL 50 MG/ML IJ SOLN
50.0000 mg | Freq: Once | INTRAMUSCULAR | Status: AC
  Administered 2018-11-11: 50 mg via INTRAVENOUS
  Filled 2018-11-11: qty 1

## 2018-11-11 MED ORDER — SODIUM CHLORIDE 0.9 % IV BOLUS
1000.0000 mL | Freq: Once | INTRAVENOUS | Status: AC
  Administered 2018-11-11: 1000 mL via INTRAVENOUS

## 2018-11-11 MED ORDER — FAMOTIDINE IN NACL 20-0.9 MG/50ML-% IV SOLN
20.0000 mg | Freq: Once | INTRAVENOUS | Status: AC
  Administered 2018-11-11: 20 mg via INTRAVENOUS
  Filled 2018-11-11: qty 50

## 2018-11-11 MED ORDER — PREDNISONE 20 MG PO TABS: 40.0000 mg | ORAL_TABLET | Freq: Every day | ORAL | 0 refills | Status: DC

## 2018-11-11 MED ORDER — METHYLPREDNISOLONE SODIUM SUCC 125 MG IJ SOLR
125.0000 mg | Freq: Once | INTRAMUSCULAR | Status: AC
  Administered 2018-11-11: 125 mg via INTRAVENOUS
  Filled 2018-11-11: qty 2

## 2018-11-11 MED ORDER — EPINEPHRINE 0.3 MG/0.3ML IJ SOAJ
0.3000 mg | Freq: Once | INTRAMUSCULAR | Status: AC
  Administered 2018-11-11: 0.3 mg via INTRAMUSCULAR
  Filled 2018-11-11: qty 0.3

## 2018-11-11 NOTE — ED Provider Notes (Signed)
[facility] Emergency Department Provider Note  Time seen: 4:40 PM  I have reviewed the triage vital signs and the nursing notes.   HISTORY  Chief Complaint Allergic Reaction   HPI Steven Carrillo is a 33 y.o. male with a past medical history of ADHD, allergies, presents to the emergency department for an apparent allergic reaction.  According to the patient for the past few months he has been going to [facility] for weekly desensitization injections.  Patient states approximately 5 minutes after receiving his injection today he became flushed, mild shortness of breath, felt swelling in his face profuse rhinorrhea, swelling in his throat so they brought the patient immediately to the emergency department for evaluation.  Upon arrival patient states continues to feel very flushed continues to feel swelling in his throat and continues to have significant rhinorrhea.  Patient does appear flushed through the upper extremities chest and face.  Feels like his face and swelling and tight.  Patient denies any fever cough congestion or shortness of breath prior to this.  Past Medical History:  Diagnosis Date  . ADHD (attention deficit hyperactivity disorder)   . Allergies   . Asthma   . Sleep apnea     Patient Active Problem List   Diagnosis Date Noted  . GAD (generalized anxiety disorder) 01/09/2018  . Eosinophilic esophagitis due to food 01/09/2018  . Non-seasonal allergic rhinitis due to pollen 01/09/2018  . Allergic conjunctivitis of both eyes 01/09/2018  . Asthma, intermittent 05/07/2017  . Class 3 severe obesity due to excess calories with serious comorbidity and body mass index (BMI) of 40.0 to 44.9 in adult [facility]) 05/07/2017    Past Surgical History:  Procedure Laterality Date  . ESOPHAGOGASTRODUODENOSCOPY (EGD) WITH PROPOFOL N/A 05/03/2017   Procedure: ESOPHAGOGASTRODUODENOSCOPY (EGD) WITH PROPOFOL;  Surgeon: [name], MD;  Location: [facility] ENDOSCOPY;   Service: Gastroenterology;  Laterality: N/A;  . FOREIGN BODY REMOVAL N/A 05/03/2017   Procedure: FOREIGN BODY REMOVAL;  Surgeon: [name], MD;  Location: [facility] ENDOSCOPY;  Service: Gastroenterology;  Laterality: N/A;  . MANDIBLE SURGERY    . TOE SURGERY      Prior to Admission medications   Medication Sig Start Date End Date Taking? Authorizing Provider  albuterol (PROVENTIL HFA) 108 (90 Base) MCG/ACT inhaler Inhale 1-2 puffs into the lungs every 6 (six) hours as needed for wheezing or shortness of breath. 04/15/18   Mar Daring, PA-C  azelastine (ASTELIN) 0.1 % nasal spray  09/19/17   [provider]  azelastine (OPTIVAR) 0.05 % ophthalmic solution Place 1 drop into both eyes 2 (two) times daily. 01/06/18   Mar Daring, PA-C  Cetirizine HCl (ZYRTEC ALLERGY) 10 MG CAPS Take by mouth.    [provider]  citalopram (CELEXA) 40 MG tablet Take 1 tablet (40 mg total) by mouth daily. 08/05/18   Ursula Alert, MD  doxycycline (VIBRA-TABS) 100 MG tablet Take 1 tablet (100 mg total) by mouth 2 (two) times daily. 09/12/18   Mar Daring, PA-C  EPINEPHrine 0.3 mg/0.3 mL IJ SOAJ injection USE AS DIRECTED AS NEEDED 04/22/18   [provider]  fluorometholone (FML) 0.1 % ophthalmic suspension INSTILL 1 DROP INTO EACH EYE 4 TIMES DAILY AS DIRECTED. SHAKE WELL 01/13/18   [provider]  hydrogen peroxide 3 % external solution Apply topically as needed. 09/12/18   Mar Daring, PA-C  hydrOXYzine (ATARAX/VISTARIL) 25 MG tablet Take 0.5-1 tablets (12.5-25 mg total) by mouth daily as needed for  anxiety. 08/05/18   Ursula Alert, MD  omeprazole (PRILOSEC) 40 MG capsule Take 1 capsule (40 mg total) by mouth daily. 01/31/18   Mar Daring, PA-C    No Known Allergies  Family History  Problem Relation Age of Onset  . Alcohol abuse Father   . Drug abuse Paternal Uncle     Social History Social History   Tobacco Use  . Smoking status:  Never Smoker  . Smokeless tobacco: Never Used  Substance Use Topics  . 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 Drug use: No    Review of Systems Constitutional: Negative for fever. ENT: Negative for congestion at home, positive for rhinorrhea since the patient's injection Cardiovascular: Negative for chest pain. Respiratory: Mild shortness of breath after the injection, no current shortness of breath.  No cough. Gastrointestinal: Negative for abdominal pain, vomiting  Musculoskeletal: Negative for musculoskeletal complaints Skin: Flushing Neurological: Negative for headache All other ROS negative  ____________________________________________   PHYSICAL EXAM:  VITAL SIGNS: ED Triage Vitals  Enc Vitals Group     BP 11/11/18 1638 130/84     Pulse Rate 11/11/18 1638 82     Resp 11/11/18 1638 20     Temp 11/11/18 1638 98.3 F (36.8 C)     Temp src --      SpO2 11/11/18 1638 98 %     Weight 11/11/18 1639 (!) 334 lb (151.5 kg)     Height 11/11/18 1639 6\' 1"  (1.854 m)     Head Circumference --      Peak Flow --      Pain Score 11/11/18 1638 0     Pain Loc --      Pain Edu? --      Excl. in [place]? --    Constitutional: Alert and oriented.  Flushed appearing Eyes: Normal exam ENT      Head: Normocephalic and atraumatic.  Moderate rhinorrhea      Mouth/Throat: Mucous membranes are moist. Cardiovascular: Normal rate, regular rhythm.  Respiratory: Normal respiratory effort without tachypnea nor retractions. Breath sounds are clear no wheeze. Gastrointestinal: Soft and nontender. No distention.   Musculoskeletal: Nontender with normal range of motion in all extremities.  Neurologic:  Normal speech and language. No gross focal neurologic deficits Skin: Flushed appearing skin across the upper extremities chest and face Psychiatric: Mood and affect are normal.   ____________________________________________   INITIAL IMPRESSION / ASSESSMENT AND PLAN / ED  COURSE  Pertinent labs & imaging results that were available during my care of the patient were reviewed by me and considered in my medical decision making (see chart for details).   Presents to the emergency department for possible allergic reaction, started several minutes after receiving an allergy injection at the allergy clinic.  Patient is describing flushing, swelling sensation in his throat, initially mild shortness of breath as well as significant rhinorrhea.  Exam consistent with likely allergic reaction.  Given the sensation of swelling in the throat although widely patent on physical exam we will dose IM epinephrine 0.3 mg, IV Benadryl, IV Solu-Medrol, IV Pepcid as well as IV fluids.  Patient agreeable to plan of care.  ----------------------------------------- 6:07 PM on 11/11/2018 -----------------------------------------  Patient is appearing much better, no longer appears flushed, continues to have very slight rhinorrhea, but states he feels much better than he did when he arrived.  We will continue to closely monitor in the emergency department.  As long the patient continues  to improve anticipate likely discharge home with prednisone, Benadryl if needed.  I have already discussed return precautions.  Patient already has EpiPen's with his person.  ----------------------------------------- 8:56 PM on 11/11/2018 -----------------------------------------  Patient appears very well.  Denies any symptoms at this time.  States he is ready to be discharged home.  I believe the patient is safe for discharge home at this time.  We will have the patient follow-up with his doctor.  We will prescribe prednisone I discussed return precautions.  Steven Carrillo was evaluated in Emergency Department on 11/11/2018 for the symptoms described in the history of present illness. He was evaluated in the context of the global COVID-19 pandemic, which necessitated consideration that the patient might be at  risk for infection with the SARS-CoV-2 virus that causes COVID-19. Institutional protocols and algorithms that pertain to the evaluation of patients at risk for COVID-19 are in a state of rapid change based on information released by regulatory bodies including the CDC and federal and state organizations. These policies and algorithms were followed during the patient's care in the ED.  ____________________________________________   FINAL CLINICAL IMPRESSION(S) / ED DIAGNOSES  Allergic reaction   Harvest Dark, MD 11/11/18 2056

## 2018-11-11 NOTE — ED Triage Notes (Signed)
Patient to ER for c/o allergic reaction. Patient was being seen for allergy shot (@ [place] [facility]), received maintenance shot for allergies, as well as tree/grass/weeds injection. Patient reports difficulty swallowing, speaking difficulty (sounds slightly muffled), and mild shortness of breath. Patient denies any reaction like this in past. +Oral swelling. Patient also arrives very flushed. Arrives from [place] clinic.

## 2019-01-07 ENCOUNTER — Telehealth (INDEPENDENT_AMBULATORY_CARE_PROVIDER_SITE_OTHER): Payer: Commercial Managed Care - PPO | Admitting: Physician Assistant

## 2019-01-07 ENCOUNTER — Encounter: Payer: Self-pay | Admitting: Physician Assistant

## 2019-01-07 ENCOUNTER — Telehealth: Payer: Self-pay

## 2019-01-07 VITALS — Wt 350.0 lb

## 2019-01-07 DIAGNOSIS — R21 Rash and other nonspecific skin eruption: Secondary | ICD-10-CM | POA: Diagnosis not present

## 2019-01-07 DIAGNOSIS — R42 Dizziness and giddiness: Secondary | ICD-10-CM

## 2019-01-07 DIAGNOSIS — R197 Diarrhea, unspecified: Secondary | ICD-10-CM | POA: Diagnosis not present

## 2019-01-07 MED ORDER — MECLIZINE HCL 25 MG PO TABS: 25.0000 mg | ORAL_TABLET | Freq: Three times a day (TID) | ORAL | 0 refills | Status: DC | PRN

## 2019-01-07 NOTE — Patient Instructions (Signed)
COVID-19 COVID-19 is a respiratory infection that is caused by a virus called severe acute respiratory syndrome coronavirus 2 (SARS-CoV-2). The disease is also known as coronavirus disease or novel coronavirus. In some people, the virus may not cause any symptoms. In others, it may cause a serious infection. The infection can get worse quickly and can lead to complications, such as:  Pneumonia, or infection of the lungs.  Acute respiratory distress syndrome or ARDS. This is fluid build-up in the lungs.  Acute respiratory failure. This is a condition in which there is not enough oxygen passing from the lungs to the body.  Sepsis or septic shock. This is a serious bodily reaction to an infection.  Blood clotting problems.  Secondary infections due to bacteria or fungus. The virus that causes COVID-19 is contagious. This means that it can spread from person to person through droplets from coughs and sneezes (respiratory secretions). What are the causes? This illness is caused by a virus. You may catch the virus by:  Breathing in droplets from an infected person's cough or sneeze.  Touching something, like a table or a doorknob, that was exposed to the virus (contaminated) and then touching your mouth, nose, or eyes. What increases the risk? Risk for infection You are more likely to be infected with this virus if you:  Live in or travel to an area with a COVID-19 outbreak.  Come in contact with a sick person who recently traveled to an area with a COVID-19 outbreak.  Provide care for or live with a person who is infected with COVID-19. Risk for serious illness You are more likely to become seriously ill from the virus if you:  Are 65 years of age or older.  Have a long-term disease that lowers your body's ability to fight infection (immunocompromised).  Live in a nursing home or long-term care facility.  Have a long-term (chronic) disease such as: ? Chronic lung disease, including  chronic obstructive pulmonary disease or asthma ? Heart disease. ? Diabetes. ? Chronic kidney disease. ? Liver disease.  Are obese. What are the signs or symptoms? Symptoms of this condition can range from mild to severe. Symptoms may appear any time from 2 to 14 days after being exposed to the virus. They include:  A fever.  A cough.  Difficulty breathing.  Chills.  Muscle pains.  A sore throat.  Loss of taste or smell. Some people may also have stomach problems, such as nausea, vomiting, or diarrhea. Other people may not have any symptoms of COVID-19. How is this diagnosed? This condition may be diagnosed based on:  Your signs and symptoms, especially if: ? You live in an area with a COVID-19 outbreak. ? You recently traveled to or from an area where the virus is common. ? You provide care for or live with a person who was diagnosed with COVID-19.  A physical exam.  Lab tests, which may include: ? A nasal swab to take a sample of fluid from your nose. ? A throat swab to take a sample of fluid from your throat. ? A sample of mucus from your lungs (sputum). ? Blood tests.  Imaging tests, which may include, X-rays, CT scan, or ultrasound. How is this treated? At present, there is no medicine to treat COVID-19. Medicines that treat other diseases are being used on a trial basis to see if they are effective against COVID-19. Your health care provider will talk with you about ways to treat your symptoms. For most   people, the infection is mild and can be managed at home with rest, fluids, and over-the-counter medicines. Treatment for a serious infection usually takes places in a hospital intensive care unit (ICU). It may include one or more of the following treatments. These treatments are given until your symptoms improve.  Receiving fluids and medicines through an IV.  Supplemental oxygen. Extra oxygen is given through a tube in the nose, a face mask, or a hood.   Positioning you to lie on your stomach (prone position). This makes it easier for oxygen to get into the lungs.  Continuous positive airway pressure (CPAP) or bi-level positive airway pressure (BPAP) machine. This treatment uses mild air pressure to keep the airways open. A tube that is connected to a motor delivers oxygen to the body.  Ventilator. This treatment moves air into and out of the lungs by using a tube that is placed in your windpipe.  Tracheostomy. This is a procedure to create a hole in the neck so that a breathing tube can be inserted.  Extracorporeal membrane oxygenation (ECMO). This procedure gives the lungs a chance to recover by taking over the functions of the heart and lungs. It supplies oxygen to the body and removes carbon dioxide. Follow these instructions at home: Lifestyle  If you are sick, stay home except to get medical care. Your health care provider will tell you how long to stay home. Call your health care provider before you go for medical care.  Rest at home as told by your health care provider.  Do not use any products that contain nicotine or tobacco, such as cigarettes, e-cigarettes, and chewing tobacco. If you need help quitting, ask your health care provider.  Return to your normal activities as told by your health care provider. Ask your health care provider what activities are safe for you. General instructions  Take over-the-counter and prescription medicines only as told by your health care provider.  Drink enough fluid to keep your urine pale yellow.  Keep all follow-up visits as told by your health care provider. This is important. How is this prevented?  There is no vaccine to help prevent COVID-19 infection. However, there are steps you can take to protect yourself and others from this virus. To protect yourself:   Do not travel to areas where COVID-19 is a risk. The areas where COVID-19 is reported change often. To identify high-risk areas  and travel restrictions, check the CDC travel website: FatFares.com.br  If you live in, or must travel to, an area where COVID-19 is a risk, take precautions to avoid infection. ? Stay away from people who are sick. ? Wash your hands often with soap and water for 20 seconds. If soap and water are not available, use an alcohol-based hand sanitizer. ? Avoid touching your mouth, face, eyes, or nose. ? Avoid going out in public, follow guidance from your state and local health authorities. ? If you must go out in public, wear a cloth face covering or face mask. ? Disinfect objects and surfaces that are frequently touched every day. This may include:  Counters and tables.  Doorknobs and light switches.  Sinks and faucets.  Electronics, such as phones, remote controls, keyboards, computers, and tablets. To protect others: If you have symptoms of COVID-19, take steps to prevent the virus from spreading to others.  If you think you have a COVID-19 infection, contact your health care provider right away. Tell your health care team that you think you may  have a COVID-19 infection.  Stay home. Leave your house only to seek medical care. Do not use public transport.  Do not travel while you are sick.  Wash your hands often with soap and water for 20 seconds. If soap and water are not available, use alcohol-based hand sanitizer.  Stay away from other members of your household. Let healthy household members care for children and pets, if possible. If you have to care for children or pets, wash your hands often and wear a mask. If possible, stay in your own room, separate from others. Use a different bathroom.  Make sure that all people in your household wash their hands well and often.  Cough or sneeze into a tissue or your sleeve or elbow. Do not cough or sneeze into your hand or into the air.  Wear a cloth face covering or face mask. Where to find more information  Centers for  Disease Control and Prevention: PurpleGadgets.be  World Health Organization: https://www.castaneda.info/ Contact a health care provider if:  You live in or have traveled to an area where COVID-19 is a risk and you have symptoms of the infection.  You have had contact with someone who has COVID-19 and you have symptoms of the infection. Get help right away if:  You have trouble breathing.  You have pain or pressure in your chest.  You have confusion.  You have bluish lips and fingernails.  You have difficulty waking from sleep.  You have symptoms that get worse. These symptoms may represent a serious problem that is an emergency. Do not wait to see if the symptoms will go away. Get medical help right away. Call your local emergency services (911 in the [place] Do not drive yourself to the hospital. Let the emergency medical personnel know if you think you have COVID-19. Summary  COVID-19 is a respiratory infection that is caused by a virus. It is also known as coronavirus disease or novel coronavirus. It can cause serious infections, such as pneumonia, acute respiratory distress syndrome, acute respiratory failure, or sepsis.  The virus that causes COVID-19 is contagious. This means that it can spread from person to person through droplets from coughs and sneezes.  You are more likely to develop a serious illness if you are 77 years of age or older, have a weak immunity, live in a nursing home, or have chronic disease.  There is no medicine to treat COVID-19. Your health care provider will talk with you about ways to treat your symptoms.  Take steps to protect yourself and others from infection. Wash your hands often and disinfect objects and surfaces that are frequently touched every day. Stay away from people who are sick and wear a mask if you are sick. This information is not intended to replace advice given to you by your health care provider.  Make sure you discuss any questions you have with your health care provider. Document Released: 07/17/2018 Document Revised: 11/06/2018 Document Reviewed: 07/17/2018 Elsevier Patient Education  2020 [employer] Name: Steven Carrillo  Location: [address] [place] [address]   Infection Prevention Recommendations for Individuals Confirmed to have, or Being Evaluated for, 2019 Novel Coronavirus (COVID-19) Infection Who Receive Care at Home  Individuals who are confirmed to have, or are being evaluated for, COVID-19 should follow the prevention steps below until a healthcare provider or local or state health department says they can return to normal activities.  Stay home except to get medical care You should restrict activities outside your home, except for getting medical care. Do not go to work, school, or public areas, and do not use public transportation or taxis.  Call ahead before visiting your doctor Before your medical appointment, call the healthcare provider and tell them that you have, or are being evaluated for, COVID-19 infection. This will help the healthcare provider's office take steps to keep other people from getting infected. Ask your healthcare provider to call the local or state health department.  Monitor your symptoms Seek prompt medical attention if your illness is worsening (e.g., difficulty breathing). Before going to your medical appointment, call the healthcare provider and tell them that you have, or are being evaluated for, COVID-19 infection. Ask your healthcare provider to call the local or state health department.  Wear a facemask You should wear a facemask that covers your nose and mouth when you are in the same room with other people and when you visit a healthcare provider. People who live with or visit you should also wear a facemask while they are in the same room with you.  Separate  yourself from other people in your home As much as possible, you should stay in a different room from other people in your home. Also, you should use a separate bathroom, if available.  Avoid sharing household items You should not share dishes, drinking glasses, cups, eating utensils, towels, bedding, or other items with other people in your home. After using these items, you should wash them thoroughly with soap and water.  Cover your coughs and sneezes Cover your mouth and nose with a tissue when you cough or sneeze, or you can cough or sneeze into your sleeve. Throw used tissues in a lined trash can, and immediately wash your hands with soap and water for at least 20 seconds or use an alcohol-based hand rub.  Wash your [employer] [employer] your hands often and thoroughly with soap and water for at least 20 seconds. You can use an alcohol-based hand sanitizer if soap and water are not available and if your hands are not visibly dirty. Avoid touching your eyes, nose, and mouth with unwashed hands.   Prevention Steps for Caregivers and Household Members of Individuals Confirmed to have, or Being Evaluated for, COVID-19 Infection Being Cared for in the Home  If you live with, or provide care at home for, a person confirmed to have, or being evaluated for, COVID-19 infection please follow these guidelines to prevent infection:  Follow healthcare provider's instructions Make sure that you understand and can help the patient follow any healthcare provider instructions for all care.  Provide for the patient's basic needs You should help the patient with basic needs in the home and provide support for getting groceries, prescriptions, and other personal needs.  Monitor the patient's symptoms If they are getting sicker, call his or her medical provider and tell them that the patient has, or is being evaluated for, COVID-19 infection. This will help the healthcare provider's office take steps to keep  other people from getting infected. Ask the healthcare provider to call the local or state health department.  Limit the number of people who have contact with the patient  If possible, have only one caregiver for the patient.  Other household members should stay in another home or place of residence. If this is not possible, they should stay  in another room, or be separated from the patient as much as  possible. Use a separate bathroom, if available.  Restrict visitors who do not have an essential need to be in the home.  Keep older adults, very young children, and other sick people away from the patient Keep older adults, very young children, and those who have compromised immune systems or chronic health conditions away from the patient. This includes people with chronic heart, lung, or kidney conditions, diabetes, and cancer.  Ensure good ventilation Make sure that shared spaces in the home have good air flow, such as from an air conditioner or an opened window, weather permitting.  Wash your hands often  Wash your hands often and thoroughly with soap and water for at least 20 seconds. You can use an alcohol based hand sanitizer if soap and water are not available and if your hands are not visibly dirty.  Avoid touching your eyes, nose, and mouth with unwashed hands.  Use disposable paper towels to dry your hands. If not available, use dedicated cloth towels and replace them when they become wet.  Wear a facemask and gloves  Wear a disposable facemask at all times in the room and gloves when you touch or have contact with the patient's blood, body fluids, and/or secretions or excretions, such as sweat, saliva, sputum, nasal mucus, vomit, urine, or feces.  Ensure the mask fits over your nose and mouth tightly, and do not touch it during use.  Throw out disposable facemasks and gloves after using them. Do not reuse.  Wash your hands immediately after removing your facemask and  gloves.  If your personal clothing becomes contaminated, carefully remove clothing and launder. Wash your hands after handling contaminated clothing.  Place all used disposable facemasks, gloves, and other waste in a lined container before disposing them with other household waste.  Remove gloves and wash your hands immediately after handling these items.  Do not share dishes, glasses, or other household items with the patient  Avoid sharing household items. You should not share dishes, drinking glasses, cups, eating utensils, towels, bedding, or other items with a patient who is confirmed to have, or being evaluated for, COVID-19 infection.  After the person uses these items, you should wash them thoroughly with soap and water.  Wash laundry thoroughly  Immediately remove and wash clothes or bedding that have blood, body fluids, and/or secretions or excretions, such as sweat, saliva, sputum, nasal mucus, vomit, urine, or feces, on them.  Wear gloves when handling laundry from the patient.  Read and follow directions on labels of laundry or clothing items and detergent. In general, wash and dry with the warmest temperatures recommended on the label.  Clean all areas the individual has used often  Clean all touchable surfaces, such as counters, tabletops, doorknobs, bathroom fixtures, toilets, phones, keyboards, tablets, and bedside tables, every day. Also, clean any surfaces that may have blood, body fluids, and/or secretions or excretions on them.  Wear gloves when cleaning surfaces the patient has come in contact with.  Use a diluted bleach solution (e.g., dilute bleach with 1 part bleach and 10 parts water) or a household disinfectant with a label that says EPA-registered for coronaviruses. To make a bleach solution at home, add 1 tablespoon of bleach to 1 quart (4 cups) of water. For a larger supply, add  cup of bleach to 1 gallon (16 cups) of water.  Read labels of cleaning  products and follow recommendations provided on product labels. Labels contain instructions for safe and effective use of the cleaning product  including precautions you should take when applying the product, such as wearing gloves or eye protection and making sure you have good ventilation during use of the product.  Remove gloves and wash hands immediately after cleaning.  Monitor yourself for signs and symptoms of illness Caregivers and household members are considered close contacts, should monitor their health, and will be asked to limit movement outside of the home to the extent possible. Follow the monitoring steps for close contacts listed on the symptom monitoring form.   ? If you have additional questions, contact your local health department or call the epidemiologist on call at [phone] (available 24/7). ? This guidance is subject to change. For the most up-to-date guidance from [facility], please refer to their website: YouBlogs.pl

## 2019-01-07 NOTE — Telephone Encounter (Signed)
[facility] called to say they received pt's medical records but they are needing the billing records.  Contact number is [phone]   Thanks,   -[name]

## 2019-01-07 NOTE — Progress Notes (Signed)
Patient: Steven Carrillo Male    DOB: 12/08/1985   33 y.o.   MRN: [id number] Visit Date: 01/07/2019  Today's Provider: Mar Daring, PA-C   No chief complaint on file.  Subjective:    I,Steven Carrillo,RMA am acting as a Education administrator for [employer], PA-C.  Virtual Visit via Video Note  I connected with Steven Carrillo on 01/07/19 at 10:00 AM EDT by a video enabled telemedicine application and verified that I am speaking with the correct person using two identifiers. Interactive audio and video communications were attempted, although failed due to patient's inability to connect to video. Continued visit with audio only interaction with patient agreement. Location: Patient: home Provider: [facility]   I discussed the limitations of evaluation and management by telemedicine and the availability of in person appointments. The patient expressed understanding and agreed to proceed.  HPI  Patient with c/o dizziness, lightheadedness,abdominal pain and uncomfortable bowel movement for the past 6-7 days. He reports that he has been feeling warm. Rash on belly for a week. Reports some cough. Patient with history of Asthma, allergies and sleep apnea.  Dizziness occurs a few times per hour and last from 5-15 minutes. This has been going on for 6-7 days. He reports he feels like he is moving. Notices even with moving his eyes.   He also c/o bruise in his back. Reports no injury.  Allergies  Allergen Reactions  . Barley Grass   . Corn-Containing Products   . Dust Mite Extract   . Other     Tree grass wheat  . Peanut-Containing Drug Products   . Pollen Extract   . Tomato      Current Outpatient Medications:  .  albuterol (PROVENTIL HFA) 108 (90 Base) MCG/ACT inhaler, Inhale 1-2 puffs into the lungs every 6 (six) hours as needed for wheezing or shortness of breath., Disp: 1 Inhaler, Rfl: 3 .  azelastine (ASTELIN) 0.1 % nasal spray, , Disp: , Rfl: 3 .  azelastine (OPTIVAR) 0.05 %  ophthalmic solution, Place 1 drop into both eyes 2 (two) times daily., Disp: 6 mL, Rfl: 12 .  Cetirizine HCl (ZYRTEC ALLERGY) 10 MG CAPS, Take by mouth., Disp: , Rfl:  .  citalopram (CELEXA) 40 MG tablet, Take 1 tablet (40 mg total) by mouth daily., Disp: 90 tablet, Rfl: 0 .  EPINEPHrine 0.3 mg/0.3 mL IJ SOAJ injection, USE AS DIRECTED AS NEEDED, Disp: , Rfl: 1 .  fluorometholone (FML) 0.1 % ophthalmic suspension, INSTILL 1 DROP INTO EACH EYE 4 TIMES DAILY AS DIRECTED. SHAKE WELL, Disp: , Rfl: 0 .  hydrogen peroxide 3 % external solution, Apply topically as needed., Disp: 120 mL, Rfl: 0 .  hydrOXYzine (ATARAX/VISTARIL) 25 MG tablet, Take 0.5-1 tablets (12.5-25 mg total) by mouth daily as needed for anxiety., Disp: 90 tablet, Rfl: 0 .  omeprazole (PRILOSEC) 40 MG capsule, Take 1 capsule (40 mg total) by mouth daily., Disp: 90 capsule, Rfl: 3  Review of Systems  Constitutional: Positive for appetite change, fatigue and fever.  HENT: Positive for congestion. Negative for postnasal drip, rhinorrhea, sinus pressure, sinus pain, sneezing and sore throat.   Respiratory: Positive for cough. Negative for chest tightness, shortness of breath and wheezing.   Gastrointestinal: Positive for nausea. Negative for abdominal distention, abdominal pain and vomiting.  Skin: Positive for rash.  Neurological: Positive for dizziness and headaches. Negative for weakness.    Social History   Tobacco Use  . Smoking status:  Never Smoker  . Smokeless tobacco: Never Used  Substance Use Topics  . 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  Constitutional:      General: He is not in acute distress. Pulmonary:     Effort: Pulmonary effort is normal. No respiratory distress.  Neurological:     Mental Status: He is alert.      No  results found for any visits on 01/07/19.     Assessment & Plan     1. Dizziness Patient does work at [employer] and reports had 5 people in one department sent home due to possible exposure. He works over [employer] so unsure, but possible exposure. Will get testing as below. Will send in meclizine for dizziness. Will f/u pending results of testing. Note for work advising patient is being tested was sent via Pharmacist, community. Isolation guidelines discussed. Call if symptoms worsen.  - Novel Coronavirus, NAA ([facility])  [place] testing site only - meclizine (ANTIVERT) 25 MG tablet; Take 1 tablet (25 mg total) by mouth 3 (three) times daily as needed for dizziness.  Dispense: 30 tablet; Refill: 0  2. Diarrhea, unspecified type Imodium for diarrhea prn.  - Novel Coronavirus, NAA ([facility])  [place] testing site only  3. Rash Benadryl cream and hydrocortisone cream OTC.  - Novel Coronavirus, NAA ([facility])  [place] testing site only  I discussed the assessment and treatment plan with the patient. The patient was provided an opportunity to ask questions and all were answered. The patient agreed with the plan and demonstrated an understanding of the instructions.   The patient was advised to call back or seek an in-person evaluation if the symptoms worsen or if the condition fails to improve as anticipated.  I provided 16 minutes of non-face-to-face time during this encounter.    Mar Daring, PA-C  [facility]

## 2019-01-10 LAB — NOVEL CORONAVIRUS, NAA: SARS-CoV-2, NAA: NOT DETECTED

## 2019-02-20 ENCOUNTER — Encounter: Payer: Self-pay | Admitting: Physician Assistant

## 2019-02-20 ENCOUNTER — Ambulatory Visit: Payer: Commercial Managed Care - PPO | Admitting: Physician Assistant

## 2019-02-20 ENCOUNTER — Other Ambulatory Visit: Payer: Self-pay

## 2019-02-20 VITALS — BP 133/81 | HR 76 | Temp 97.1°F | Resp 16 | Wt 348.0 lb

## 2019-02-20 DIAGNOSIS — R252 Cramp and spasm: Secondary | ICD-10-CM

## 2019-02-20 DIAGNOSIS — S86911A Strain of unspecified muscle(s) and tendon(s) at lower leg level, right leg, initial encounter: Secondary | ICD-10-CM | POA: Diagnosis not present

## 2019-02-20 MED ORDER — MELOXICAM 15 MG PO TABS: 15.0000 mg | ORAL_TABLET | Freq: Every day | ORAL | 0 refills | Status: DC

## 2019-02-20 MED ORDER — BACLOFEN 10 MG PO TABS: 10.0000 mg | ORAL_TABLET | Freq: Three times a day (TID) | ORAL | 0 refills | Status: DC

## 2019-02-20 MED ORDER — KETOROLAC TROMETHAMINE 60 MG/2ML IM SOLN
60.0000 mg | Freq: Once | INTRAMUSCULAR | Status: AC
  Administered 2019-02-20: 60 mg via INTRAMUSCULAR

## 2019-02-20 NOTE — Patient Instructions (Signed)
Muscle Strain A muscle strain is an injury that happens when a muscle is stretched longer than normal. This can happen during a fall, sports, or lifting. This can tear some muscle fibers. Usually, recovery from muscle strain takes 1-2 weeks. Complete healing normally takes 5-6 weeks. This condition is first treated with PRICE therapy. This involves:  Protecting your muscle from being injured again.  Resting your injured muscle.  Icing your injured muscle.  Applying pressure (compression) to your injured muscle. This may be done with a splint or elastic bandage.  Raising (elevating) your injured muscle. Your doctor may also recommend medicine for pain. Follow these instructions at home: If you have a splint:  Wear the splint as told by your doctor. Take it off only as told by your doctor.  Loosen the splint if your fingers or toes tingle, get numb, or turn cold and blue.  Keep the splint clean.  If the splint is not waterproof: ? Do not let it get wet. ? Cover it with a watertight covering when you take a bath or a shower. Managing pain, stiffness, and swelling   If directed, put ice on your injured area. ? If you have a removable splint, take it off as told by your doctor. ? Put ice in a plastic bag. ? Place a towel between your skin and the bag. ? Leave the ice on for 20 minutes, 2-3 times a day.  Move your fingers or toes often. This helps to avoid stiffness and lessen swelling.  Raise your injured area above the level of your heart while you are sitting or lying down.  Wear an elastic bandage as told by your doctor. Make sure it is not too tight. General instructions  Take over-the-counter and prescription medicines only as told by your doctor.  Limit your activity. Rest your injured muscle as told by your doctor. Your doctor may say that gentle movements are okay.  If physical therapy was prescribed, do exercises as told by your doctor.  Do not put pressure on any  part of the splint until it is fully hardened. This may take many hours.  Do not use any products that contain nicotine or tobacco, such as cigarettes and e-cigarettes. These can delay bone healing. If you need help quitting, ask your doctor.  Warm up before you exercise. This helps to prevent more muscle strains.  Ask your doctor when it is safe to drive if you have a splint.  Keep all follow-up visits as told by your doctor. This is important. Contact a doctor if:  You have more pain or swelling in your injured area. Get help right away if:  You have any of these problems in your injured area: ? You have numbness. ? You have tingling. ? You lose a lot of strength. Summary  A muscle strain is an injury that happens when a muscle is stretched longer than normal.  This condition is first treated with PRICE therapy. This includes protecting, resting, icing, adding pressure, and raising your injury.  Limit your activity. Rest your injured muscle as told by your doctor. Your doctor may say that gentle movements are okay.  Warm up before you exercise. This helps to prevent more muscle strains. This information is not intended to replace advice given to you by your health care provider. Make sure you discuss any questions you have with your health care provider. Document Released: 03/20/2008 Document Revised: 08/07/2018 Document Reviewed: 07/18/2016 Elsevier Patient Education  2020 [employer]  

## 2019-02-20 NOTE — Progress Notes (Signed)
Patient: Steven Carrillo Male    DOB: 04-13-1986   33 y.o.   MRN: [id number] Visit Date: 02/20/2019  Today's Provider: Mar Daring, PA-C   Chief Complaint  Patient presents with  . Leg Pain   Subjective:     HPI Patient here today c/o right leg pain since yesterday. Patient denies any injuries. Patient reports taking hydrocodone last night reports moderate pain control. States he feels like he pushed himself to hard at work and just strained a muscle in his leg. No acute injury known.    Allergies  Allergen Reactions  . Barley Grass   . Corn-Containing Products   . Dust Mite Extract   . Other     Tree grass wheat  . Peanut-Containing Drug Products   . Pollen Extract   . Tomato      Current Outpatient Medications:  .  albuterol (PROVENTIL HFA) 108 (90 Base) MCG/ACT inhaler, Inhale 1-2 puffs into the lungs every 6 (six) hours as needed for wheezing or shortness of breath., Disp: 1 Inhaler, Rfl: 3 .  azelastine (ASTELIN) 0.1 % nasal spray, , Disp: , Rfl: 3 .  azelastine (OPTIVAR) 0.05 % ophthalmic solution, Place 1 drop into both eyes 2 (two) times daily., Disp: 6 mL, Rfl: 12 .  Cetirizine HCl (ZYRTEC ALLERGY) 10 MG CAPS, Take by mouth., Disp: , Rfl:  .  citalopram (CELEXA) 40 MG tablet, Take 1 tablet (40 mg total) by mouth daily., Disp: 90 tablet, Rfl: 0 .  hydrogen peroxide 3 % external solution, Apply topically as needed., Disp: 120 mL, Rfl: 0 .  hydrOXYzine (ATARAX/VISTARIL) 25 MG tablet, Take 0.5-1 tablets (12.5-25 mg total) by mouth daily as needed for anxiety., Disp: 90 tablet, Rfl: 0 .  omeprazole (PRILOSEC) 40 MG capsule, Take 1 capsule (40 mg total) by mouth daily., Disp: 90 capsule, Rfl: 3 .  EPINEPHrine 0.3 mg/0.3 mL IJ SOAJ injection, USE AS DIRECTED AS NEEDED, Disp: , Rfl: 1 .  meclizine (ANTIVERT) 25 MG tablet, Take 1 tablet (25 mg total) by mouth 3 (three) times daily as needed for dizziness. (Patient not taking: Reported on 02/20/2019), Disp: 30  tablet, Rfl: 0  Review of Systems  Constitutional: Negative.   Respiratory: Negative.   Cardiovascular: Negative.   Musculoskeletal: Positive for gait problem and myalgias. Negative for arthralgias, back pain and joint swelling.  Neurological: Negative for weakness and numbness.    Social History   Tobacco Use  . Smoking status: Never Smoker  . Smokeless tobacco: Never Used  Substance Use Topics  . 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  Constitutional:      General: He is not in acute distress.    Appearance: Normal appearance. He is well-developed. He is obese. He is not diaphoretic.  HENT:     Head: Normocephalic and atraumatic.  Neck:     Musculoskeletal: Normal range of motion and neck supple.  Cardiovascular:     Rate and Rhythm: Normal rate and regular rhythm.     Heart sounds: Normal heart sounds. No murmur. No friction rub. No gallop.   Pulmonary:     Effort: Pulmonary effort is normal. No respiratory distress.     Breath sounds: Normal breath sounds. No wheezing or rales.  Musculoskeletal:     Right hip: Normal.     Right knee: Normal.     Right ankle: Normal.  Neurological:     Mental Status: He is alert.      No results found for any visits on 02/20/19.     Assessment & Plan    1. Muscle strain of lower extremity, right, initial encounter Suspect muscle strain from excess weight and more activity (walking on concrete) strained the muscles in his lower leg. All joints are intact and normal exams. Will give toradol injection today in the office.  Start Meloxicam tomorrow. Baclofen will be given for muscle cramps. Call if worsening.  - ketorolac (TORADOL) injection 60 mg - baclofen (LIORESAL) 10 MG tablet; Take 1 tablet (10 mg total) by mouth 3 (three) times daily.  Dispense: 30 each; Refill: 0 - meloxicam (MOBIC) 15 MG tablet; Take 1 tablet (15 mg total) by mouth daily.  Dispense: 30 tablet; Refill: 0  2. Muscle cramp See above medical treatment plan. - ketorolac (TORADOL) injection 60 mg - baclofen (LIORESAL) 10 MG tablet; Take 1 tablet (10 mg total) by mouth 3 (three) times daily.  Dispense: 30 each; Refill: 0 - meloxicam (MOBIC) 15 MG tablet; Take 1 tablet (15 mg total) by mouth daily.  Dispense: 30 tablet; Refill: 0     Mar Daring, PA-C  [facility]

## 2019-02-24 ENCOUNTER — Telehealth: Payer: Self-pay

## 2019-02-24 NOTE — Telephone Encounter (Signed)
Pt is requesting Tawanna Sat return his call to discuss if she thinks if a few more days off work would be better for his leg. Pt stated that he was having a lot of leg pain after being on his leg for an hour today. Pt stated that he would really like to speak with Tawanna Sat to describe the pain he had today. Please advise. Thanks TNP

## 2019-02-25 NOTE — Telephone Encounter (Signed)
Called patient and left VM

## 2019-03-02 ENCOUNTER — Other Ambulatory Visit: Payer: Self-pay | Admitting: Physician Assistant

## 2019-03-02 DIAGNOSIS — K200 Eosinophilic esophagitis: Secondary | ICD-10-CM

## 2019-03-03 ENCOUNTER — Ambulatory Visit (INDEPENDENT_AMBULATORY_CARE_PROVIDER_SITE_OTHER): Payer: Commercial Managed Care - PPO | Admitting: Physician Assistant

## 2019-03-03 ENCOUNTER — Encounter: Payer: Self-pay | Admitting: Physician Assistant

## 2019-03-03 ENCOUNTER — Ambulatory Visit: Payer: Self-pay | Admitting: Physician Assistant

## 2019-03-03 ENCOUNTER — Other Ambulatory Visit: Payer: Self-pay

## 2019-03-03 ENCOUNTER — Telehealth: Payer: Self-pay | Admitting: Physician Assistant

## 2019-03-03 VITALS — Temp 100.0°F

## 2019-03-03 DIAGNOSIS — Z20822 Contact with and (suspected) exposure to covid-19: Secondary | ICD-10-CM

## 2019-03-03 DIAGNOSIS — R6889 Other general symptoms and signs: Secondary | ICD-10-CM

## 2019-03-03 DIAGNOSIS — J029 Acute pharyngitis, unspecified: Secondary | ICD-10-CM

## 2019-03-03 DIAGNOSIS — R509 Fever, unspecified: Secondary | ICD-10-CM

## 2019-03-03 DIAGNOSIS — U071 COVID-19: Secondary | ICD-10-CM

## 2019-03-03 NOTE — Telephone Encounter (Signed)
Pt called saying he is having fever, 100.5.  Chills, sore throat, sick to stomach, body aches.  We have no appts today.  CB# [phone]    Con Memos

## 2019-03-03 NOTE — Patient Instructions (Addendum)
Can take to lessen severity: Vit C 500mg  twice daily Quercertin 250-500mg  twice daily Zinc 75-100mg  daily Melatonin 3-6 mg at bedtime Vit D3 1000-2000 IU daily Aspirin 81 mg daily with food Optional: Famotidine 20mg  daily Also can add tylenol/ibuprofen as needed for fevers and body aches May add Mucinex or Mucinex DM as needed for cough/congestion  COVID-19 COVID-19 is a respiratory infection that is caused by a virus called severe acute respiratory syndrome coronavirus 2 (SARS-CoV-2). The disease is also known as coronavirus disease or novel coronavirus. In some people, the virus may not cause any symptoms. In others, it may cause a serious infection. The infection can get worse quickly and can lead to complications, such as:  Pneumonia, or infection of the lungs.  Acute respiratory distress syndrome or ARDS. This is fluid build-up in the lungs.  Acute respiratory failure. This is a condition in which there is not enough oxygen passing from the lungs to the body.  Sepsis or septic shock. This is a serious bodily reaction to an infection.  Blood clotting problems.  Secondary infections due to bacteria or fungus. The virus that causes COVID-19 is contagious. This means that it can spread from person to person through droplets from coughs and sneezes (respiratory secretions). What are the causes? This illness is caused by a virus. You may catch the virus by:  Breathing in droplets from an infected person's cough or sneeze.  Touching something, like a table or a doorknob, that was exposed to the virus (contaminated) and then touching your mouth, nose, or eyes. What increases the risk? Risk for infection You are more likely to be infected with this virus if you:  Live in or travel to an area with a COVID-19 outbreak.  Come in contact with a sick person who recently traveled to an area with a COVID-19 outbreak.  Provide care for or live with a person who is infected with COVID-19.  Risk for serious illness You are more likely to become seriously ill from the virus if you:  Are 26 years of age or older.  Have a long-term disease that lowers your body's ability to fight infection (immunocompromised).  Live in a nursing home or long-term care facility.  Have a long-term (chronic) disease such as: ? Chronic lung disease, including chronic obstructive pulmonary disease or asthma ? Heart disease. ? Diabetes. ? Chronic kidney disease. ? Liver disease.  Are obese. What are the signs or symptoms? Symptoms of this condition can range from mild to severe. Symptoms may appear any time from 2 to 14 days after being exposed to the virus. They include:  A fever.  A cough.  Difficulty breathing.  Chills.  Muscle pains.  A sore throat.  Loss of taste or smell. Some people may also have stomach problems, such as nausea, vomiting, or diarrhea. Other people may not have any symptoms of COVID-19. How is this diagnosed? This condition may be diagnosed based on:  Your signs and symptoms, especially if: ? You live in an area with a COVID-19 outbreak. ? You recently traveled to or from an area where the virus is common. ? You provide care for or live with a person who was diagnosed with COVID-19.  A physical exam.  Lab tests, which may include: ? A nasal swab to take a sample of fluid from your nose. ? A throat swab to take a sample of fluid from your throat. ? A sample of mucus from your lungs (sputum). ? Blood tests.  Imaging tests, which may include, X-rays, CT scan, or ultrasound. How is this treated? At present, there is no medicine to treat COVID-19. Medicines that treat other diseases are being used on a trial basis to see if they are effective against COVID-19. Your health care provider will talk with you about ways to treat your symptoms. For most people, the infection is mild and can be managed at home with rest, fluids, and over-the-counter medicines.  Treatment for a serious infection usually takes places in a hospital intensive care unit (ICU). It may include one or more of the following treatments. These treatments are given until your symptoms improve.  Receiving fluids and medicines through an IV.  Supplemental oxygen. Extra oxygen is given through a tube in the nose, a face mask, or a hood.  Positioning you to lie on your stomach (prone position). This makes it easier for oxygen to get into the lungs.  Continuous positive airway pressure (CPAP) or bi-level positive airway pressure (BPAP) machine. This treatment uses mild air pressure to keep the airways open. A tube that is connected to a motor delivers oxygen to the body.  Ventilator. This treatment moves air into and out of the lungs by using a tube that is placed in your windpipe.  Tracheostomy. This is a procedure to create a hole in the neck so that a breathing tube can be inserted.  Extracorporeal membrane oxygenation (ECMO). This procedure gives the lungs a chance to recover by taking over the functions of the heart and lungs. It supplies oxygen to the body and removes carbon dioxide. Follow these instructions at home: Lifestyle  If you are sick, stay home except to get medical care. Your health care provider will tell you how long to stay home. Call your health care provider before you go for medical care.  Rest at home as told by your health care provider.  Do not use any products that contain nicotine or tobacco, such as cigarettes, e-cigarettes, and chewing tobacco. If you need help quitting, ask your health care provider.  Return to your normal activities as told by your health care provider. Ask your health care provider what activities are safe for you. General instructions  Take over-the-counter and prescription medicines only as told by your health care provider.  Drink enough fluid to keep your urine pale yellow.  Keep all follow-up visits as told by your  health care provider. This is important. How is this prevented?  There is no vaccine to help prevent COVID-19 infection. However, there are steps you can take to protect yourself and others from this virus. To protect yourself:   Do not travel to areas where COVID-19 is a risk. The areas where COVID-19 is reported change often. To identify high-risk areas and travel restrictions, check the CDC travel website: FatFares.com.br  If you live in, or must travel to, an area where COVID-19 is a risk, take precautions to avoid infection. ? Stay away from people who are sick. ? Wash your hands often with soap and water for 20 seconds. If soap and water are not available, use an alcohol-based hand sanitizer. ? Avoid touching your mouth, face, eyes, or nose. ? Avoid going out in public, follow guidance from your state and local health authorities. ? If you must go out in public, wear a cloth face covering or face mask. ? Disinfect objects and surfaces that are frequently touched every day. This may include:  Counters and tables.  Doorknobs and light switches.  Sinks and faucets.  Electronics, such as phones, remote controls, keyboards, computers, and tablets. To protect others: If you have symptoms of COVID-19, take steps to prevent the virus from spreading to others.  If you think you have a COVID-19 infection, contact your health care provider right away. Tell your health care team that you think you may have a COVID-19 infection.  Stay home. Leave your house only to seek medical care. Do not use public transport.  Do not travel while you are sick.  Wash your hands often with soap and water for 20 seconds. If soap and water are not available, use alcohol-based hand sanitizer.  Stay away from other members of your household. Let healthy household members care for children and pets, if possible. If you have to care for children or pets, wash your hands often and wear a mask. If  possible, stay in your own room, separate from others. Use a different bathroom.  Make sure that all people in your household wash their hands well and often.  Cough or sneeze into a tissue or your sleeve or elbow. Do not cough or sneeze into your hand or into the air.  Wear a cloth face covering or face mask. Where to find more information  Centers for Disease Control and Prevention: PurpleGadgets.be  World Health Organization: https://www.castaneda.info/ Contact a health care provider if:  You live in or have traveled to an area where COVID-19 is a risk and you have symptoms of the infection.  You have had contact with someone who has COVID-19 and you have symptoms of the infection. Get help right away if:  You have trouble breathing.  You have pain or pressure in your chest.  You have confusion.  You have bluish lips and fingernails.  You have difficulty waking from sleep.  You have symptoms that get worse. These symptoms may represent a serious problem that is an emergency. Do not wait to see if the symptoms will go away. Get medical help right away. Call your local emergency services (911 in the [place] Do not drive yourself to the hospital. Let the emergency medical personnel know if you think you have COVID-19. Summary  COVID-19 is a respiratory infection that is caused by a virus. It is also known as coronavirus disease or novel coronavirus. It can cause serious infections, such as pneumonia, acute respiratory distress syndrome, acute respiratory failure, or sepsis.  The virus that causes COVID-19 is contagious. This means that it can spread from person to person through droplets from coughs and sneezes.  You are more likely to develop a serious illness if you are 61 years of age or older, have a weak immunity, live in a nursing home, or have chronic disease.  There is no medicine to treat COVID-19. Your health care provider will  talk with you about ways to treat your symptoms.  Take steps to protect yourself and others from infection. Wash your hands often and disinfect objects and surfaces that are frequently touched every day. Stay away from people who are sick and wear a mask if you are sick. This information is not intended to replace advice given to you by your health care provider. Make sure you discuss any questions you have with your health care provider. Document Released: 07/17/2018 Document Revised: 11/06/2018 Document Reviewed: 07/17/2018 Elsevier Patient Education  2020 [place].     Person Under Monitoring Name: Steven Carrillo  Location: [address] [place] [address]  Infection Prevention Recommendations for Individuals Confirmed to have, or Being Evaluated for, 2019 Novel Coronavirus (COVID-19) Infection Who Receive Care at Home  Individuals who are confirmed to have, or are being evaluated for, COVID-19 should follow the prevention steps below until a healthcare provider or local or state health department says they can return to normal activities.  Stay home except to get medical care You should restrict activities outside your home, except for getting medical care. Do not go to work, school, or public areas, and do not use public transportation or taxis.  Call ahead before visiting your doctor Before your medical appointment, call the healthcare provider and tell them that you have, or are being evaluated for, COVID-19 infection. This will help the healthcare provider's office take steps to keep other people from getting infected. Ask your healthcare provider to call the local or state health department.  Monitor your symptoms Seek prompt medical attention if your illness is worsening (e.g., difficulty breathing). Before going to your medical appointment, call the healthcare provider and tell them that you have, or are being evaluated for, COVID-19 infection.  Ask your healthcare provider to call the local or state health department.  Wear a facemask You should wear a facemask that covers your nose and mouth when you are in the same room with other people and when you visit a healthcare provider. People who live with or visit you should also wear a facemask while they are in the same room with you.  Separate yourself from other people in your home As much as possible, you should stay in a different room from other people in your home. Also, you should use a separate bathroom, if available.  Avoid sharing household items You should not share dishes, drinking glasses, cups, eating utensils, towels, bedding, or other items with other people in your home. After using these items, you should wash them thoroughly with soap and water.  Cover your coughs and sneezes Cover your mouth and nose with a tissue when you cough or sneeze, or you can cough or sneeze into your sleeve. Throw used tissues in a lined trash can, and immediately wash your hands with soap and water for at least 20 seconds or use an alcohol-based hand rub.  Wash your [facility] your hands often and thoroughly with soap and water for at least 20 seconds. You can use an alcohol-based hand sanitizer if soap and water are not available and if your hands are not visibly dirty. Avoid touching your eyes, nose, and mouth with unwashed hands.   Prevention Steps for Caregivers and Household Members of Individuals Confirmed to have, or Being Evaluated for, COVID-19 Infection Being Cared for in the Home  If you live with, or provide care at home for, a person confirmed to have, or being evaluated for, COVID-19 infection please follow these guidelines to prevent infection:  Follow healthcare provider's instructions Make sure that you understand and can help the patient follow any healthcare provider instructions for all care.  Provide for the patient's basic needs You should help the  patient with basic needs in the home and provide support for getting groceries, prescriptions, and other personal needs.  Monitor the patient's symptoms If they are getting sicker, call his or her medical provider and tell them that the patient has, or is being evaluated for, COVID-19 infection. This will help the healthcare provider's office take steps to keep other people from getting infected. Ask the healthcare provider to call the local or  state health department.  Limit the number of people who have contact with the patient  If possible, have only one caregiver for the patient.  Other household members should stay in another home or place of residence. If this is not possible, they should stay  in another room, or be separated from the patient as much as possible. Use a separate bathroom, if available.  Restrict visitors who do not have an essential need to be in the home.  Keep older adults, very young children, and other sick people away from the patient Keep older adults, very young children, and those who have compromised immune systems or chronic health conditions away from the patient. This includes people with chronic heart, lung, or kidney conditions, diabetes, and cancer.  Ensure good ventilation Make sure that shared spaces in the home have good air flow, such as from an air conditioner or an opened window, weather permitting.  Wash your hands often  Wash your hands often and thoroughly with soap and water for at least 20 seconds. You can use an alcohol based hand sanitizer if soap and water are not available and if your hands are not visibly dirty.  Avoid touching your eyes, nose, and mouth with unwashed hands.  Use disposable paper towels to dry your hands. If not available, use dedicated cloth towels and replace them when they become wet.  Wear a facemask and gloves  Wear a disposable facemask at all times in the room and gloves when you touch or have contact  with the patient's blood, body fluids, and/or secretions or excretions, such as sweat, saliva, sputum, nasal mucus, vomit, urine, or feces.  Ensure the mask fits over your nose and mouth tightly, and do not touch it during use.  Throw out disposable facemasks and gloves after using them. Do not reuse.  Wash your hands immediately after removing your facemask and gloves.  If your personal clothing becomes contaminated, carefully remove clothing and launder. Wash your hands after handling contaminated clothing.  Place all used disposable facemasks, gloves, and other waste in a lined container before disposing them with other household waste.  Remove gloves and wash your hands immediately after handling these items.  Do not share dishes, glasses, or other household items with the patient  Avoid sharing household items. You should not share dishes, drinking glasses, cups, eating utensils, towels, bedding, or other items with a patient who is confirmed to have, or being evaluated for, COVID-19 infection.  After the person uses these items, you should wash them thoroughly with soap and water.  Wash laundry thoroughly  Immediately remove and wash clothes or bedding that have blood, body fluids, and/or secretions or excretions, such as sweat, saliva, sputum, nasal mucus, vomit, urine, or feces, on them.  Wear gloves when handling laundry from the patient.  Read and follow directions on labels of laundry or clothing items and detergent. In general, wash and dry with the warmest temperatures recommended on the label.  Clean all areas the individual has used often  Clean all touchable surfaces, such as counters, tabletops, doorknobs, bathroom fixtures, toilets, phones, keyboards, tablets, and bedside tables, every day. Also, clean any surfaces that may have blood, body fluids, and/or secretions or excretions on them.  Wear gloves when cleaning surfaces the patient has come in contact with.  Use  a diluted bleach solution (e.g., dilute bleach with 1 part bleach and 10 parts water) or a household disinfectant with a label that says EPA-registered for coronaviruses. To  make a bleach solution at home, add 1 tablespoon of bleach to 1 quart (4 cups) of water. For a larger supply, add  cup of bleach to 1 gallon (16 cups) of water.  Read labels of cleaning products and follow recommendations provided on product labels. Labels contain instructions for safe and effective use of the cleaning product including precautions you should take when applying the product, such as wearing gloves or eye protection and making sure you have good ventilation during use of the product.  Remove gloves and wash hands immediately after cleaning.  Monitor yourself for signs and symptoms of illness Caregivers and household members are considered close contacts, should monitor their health, and will be asked to limit movement outside of the home to the extent possible. Follow the monitoring steps for close contacts listed on the symptom monitoring form.   ? If you have additional questions, contact your local health department or call the epidemiologist on call at [phone] (available 24/7). ? This guidance is subject to change. For the most up-to-date guidance from [facility], please refer to their website: [email or web address]

## 2019-03-03 NOTE — Telephone Encounter (Signed)
Advised pt he needs to be tested for covid and schedule a virtual visit, or go to urgent care. Patient is agreeable to 4:00 doxy visit. Covid test ordered. E-mail sent with doxy link.

## 2019-03-03 NOTE — Progress Notes (Signed)
Patient: Steven Carrillo Male    DOB: April 02, 1986   33 y.o.   MRN: [id number] Visit Date: 03/03/2019  Today's Provider: Mar Daring, PA-C   Chief Complaint  Patient presents with  . Fever   Subjective:     Virtual Visit via Video Note  I connected with Steven Carrillo on 03/03/19 at  4:00 PM EDT by a video enabled telemedicine application and verified that I am speaking with the correct person using two identifiers.  Location: Patient: Home Provider: [facility]   I discussed the limitations of evaluation and management by telemedicine and the availability of in person appointments. The patient expressed understanding and agreed to proceed.  Fever  This is a new problem. The current episode started in the past 7 days (sore throat was first symptom that started thursday, 02/26/19). The problem occurs 2 to 4 times per day. The problem has been gradually worsening. The temperature was taken using an oral thermometer. Associated symptoms include congestion, coughing, headaches, muscle aches, sleepiness and a sore throat. Pertinent negatives include no abdominal pain, chest pain, diarrhea, ear pain, nausea, rash, urinary pain, vomiting or wheezing. He has tried acetaminophen (dayquil) for the symptoms. The treatment provided mild (works for approx 4 hours then fever starts returning) relief.  Risk factors: recent travel (went to beach this weekend)   Risk factors: no immunosuppression, no occupational exposure, no recent sickness and no sick contacts        Allergies  Allergen Reactions  . Barley Grass   . Corn-Containing Products   . Dust Mite Extract   . Other     Tree grass wheat  . Peanut-Containing Drug Products   . Pollen Extract   . Tomato      Current Outpatient Medications:  .  albuterol (PROVENTIL HFA) 108 (90 Base) MCG/ACT inhaler, Inhale 1-2 puffs into the lungs every 6 (six) hours as needed for wheezing or shortness of breath., Disp: 1 Inhaler, Rfl: 3 .   azelastine (ASTELIN) 0.1 % nasal spray, , Disp: , Rfl: 3 .  azelastine (OPTIVAR) 0.05 % ophthalmic solution, Place 1 drop into both eyes 2 (two) times daily., Disp: 6 mL, Rfl: 12 .  baclofen (LIORESAL) 10 MG tablet, Take 1 tablet (10 mg total) by mouth 3 (three) times daily., Disp: 30 each, Rfl: 0 .  Cetirizine HCl (ZYRTEC ALLERGY) 10 MG CAPS, Take by mouth., Disp: , Rfl:  .  citalopram (CELEXA) 40 MG tablet, Take 1 tablet (40 mg total) by mouth daily., Disp: 90 tablet, Rfl: 0 .  EPINEPHrine 0.3 mg/0.3 mL IJ SOAJ injection, USE AS DIRECTED AS NEEDED, Disp: , Rfl: 1 .  hydrOXYzine (ATARAX/VISTARIL) 25 MG tablet, Take 0.5-1 tablets (12.5-25 mg total) by mouth daily as needed for anxiety., Disp: 90 tablet, Rfl: 0 .  meloxicam (MOBIC) 15 MG tablet, Take 1 tablet (15 mg total) by mouth daily., Disp: 30 tablet, Rfl: 0 .  omeprazole (PRILOSEC) 40 MG capsule, TAKE 1 CAPSULE DAILY, Disp: 90 capsule, Rfl: 3 .  hydrogen peroxide 3 % external solution, Apply topically as needed. (Patient not taking: Reported on 03/03/2019), Disp: 120 mL, Rfl: 0 .  meclizine (ANTIVERT) 25 MG tablet, Take 1 tablet (25 mg total) by mouth 3 (three) times daily as needed for dizziness. (Patient not taking: Reported on 02/20/2019), Disp: 30 tablet, Rfl: 0  Review of Systems  Constitutional: Positive for chills, fatigue and fever.  HENT: Positive for congestion, sinus pressure and sore throat.  Negative for ear pain, postnasal drip, rhinorrhea and trouble swallowing.   Respiratory: Positive for cough. Negative for chest tightness, shortness of breath and wheezing.   Cardiovascular: Negative for chest pain, palpitations and leg swelling.  Gastrointestinal: Negative for abdominal pain, diarrhea, nausea and vomiting.  Genitourinary: Negative for dysuria.  Musculoskeletal: Positive for myalgias.  Skin: Negative for rash.  Neurological: Positive for headaches. Negative for dizziness.    Social History   Tobacco Use  . Smoking status:  Never Smoker  . Smokeless tobacco: Never Used  Substance Use Topics  . Alcohol use: Not Currently    Comment: allergic to barley      Objective:   Temp 100 F (37.8 C) (Oral)  Vitals:   03/03/19 1528  Temp: 100 F (37.8 C)  TempSrc: Oral  There is no height or weight on file to calculate BMI.   Physical Exam Constitutional:      General: He is not in acute distress.    Appearance: Normal appearance. He is obese. He is ill-appearing.  HENT:     Head: Normocephalic and atraumatic.     Nose:     Comments: Patient reports mild sinus tenderness and swollen glands in neck Pulmonary:     Effort: Pulmonary effort is normal. No respiratory distress.  Neurological:     Mental Status: He is alert.      No results found for any visits on 03/03/19.     Assessment & Plan     1. Suspected Covid-19 Virus Infection Will send for covid testing. Was given isolation instructions and work note. Entered into home monitoring program. Call if worsening. Will f/u pending results. Information/AVS sent through [employer]. Work note sent through [employer]. Continue symptomatic treatment as we await results. Call if symptoms change or worsen.  - MyChart COVID-19 home monitoring program; Future - Temperature monitoring; Future  2. Sore throat See above medical treatment plan. - MyChart COVID-19 home monitoring program; Future - Temperature monitoring; Future  3. Fever, unspecified fever cause See above medical treatment plan. - MyChart COVID-19 home monitoring program; Future - Temperature monitoring; Future   I discussed the assessment and treatment plan with the patient. The patient was provided an opportunity to ask questions and all were answered. The patient agreed with the plan and demonstrated an understanding of the instructions.   The patient was advised to call back or seek an in-person evaluation if the symptoms worsen or if the condition fails to improve as anticipated.  I  provided 14 minutes of non-face-to-face time during this encounter.    Mar Daring, PA-C  [facility]

## 2019-03-04 ENCOUNTER — Encounter: Payer: Self-pay | Admitting: Physician Assistant

## 2019-03-04 ENCOUNTER — Encounter (INDEPENDENT_AMBULATORY_CARE_PROVIDER_SITE_OTHER): Payer: Self-pay

## 2019-03-04 DIAGNOSIS — J029 Acute pharyngitis, unspecified: Secondary | ICD-10-CM

## 2019-03-05 ENCOUNTER — Encounter (INDEPENDENT_AMBULATORY_CARE_PROVIDER_SITE_OTHER): Payer: Self-pay

## 2019-03-05 ENCOUNTER — Telehealth: Payer: Self-pay

## 2019-03-05 LAB — NOVEL CORONAVIRUS, NAA: SARS-CoV-2, NAA: NOT DETECTED

## 2019-03-05 MED ORDER — AMOXICILLIN-POT CLAVULANATE 875-125 MG PO TABS: 1.0000 | ORAL_TABLET | Freq: Two times a day (BID) | ORAL | 0 refills | Status: DC

## 2019-03-05 NOTE — Telephone Encounter (Signed)
Pt called this morning saying he just got a call from [facility] saying his Covid test was neg.  He wants to know if you will prescribe an antibiotic in case it's strep [phone].  Con Memos

## 2019-03-05 NOTE — Telephone Encounter (Signed)
[place] call -   Patient report not having appetite due to very sore throat. Fever: 98.1 Patient reports emailed PCP yesterday morning (Wednesday morning)  - no response yet. Reviewed MyChart home monitoring protocol with patient -suggested warm - hot fluids to help soothe throat until he hears from his PCP. No further questions/concerns.

## 2019-03-06 ENCOUNTER — Telehealth: Payer: Self-pay | Admitting: *Deleted

## 2019-03-06 ENCOUNTER — Encounter (INDEPENDENT_AMBULATORY_CARE_PROVIDER_SITE_OTHER): Payer: Self-pay

## 2019-03-06 ENCOUNTER — Other Ambulatory Visit: Payer: Self-pay

## 2019-03-06 ENCOUNTER — Emergency Department
Admission: EM | Admit: 2019-03-06 | Discharge: 2019-03-06 | Disposition: A | Discharge status: Home / Self Care | Payer: Commercial Managed Care - PPO | Attending: Student in an Organized Health Care Education/Training Program | Admitting: Student in an Organized Health Care Education/Training Program

## 2019-03-06 ENCOUNTER — Encounter: Payer: Self-pay | Admitting: Intensive Care

## 2019-03-06 DIAGNOSIS — Z79899 Other long term (current) drug therapy: Secondary | ICD-10-CM | POA: Diagnosis not present

## 2019-03-06 DIAGNOSIS — B349 Viral infection, unspecified: Secondary | ICD-10-CM | POA: Diagnosis not present

## 2019-03-06 DIAGNOSIS — Z20828 Contact with and (suspected) exposure to other viral communicable diseases: Secondary | ICD-10-CM | POA: Insufficient documentation

## 2019-03-06 DIAGNOSIS — J45909 Unspecified asthma, uncomplicated: Secondary | ICD-10-CM | POA: Diagnosis not present

## 2019-03-06 DIAGNOSIS — K209 Esophagitis, unspecified: Secondary | ICD-10-CM | POA: Diagnosis not present

## 2019-03-06 DIAGNOSIS — Z91010 Allergy to peanuts: Secondary | ICD-10-CM | POA: Diagnosis not present

## 2019-03-06 DIAGNOSIS — K2970 Gastritis, unspecified, without bleeding: Secondary | ICD-10-CM | POA: Diagnosis not present

## 2019-03-06 DIAGNOSIS — M791 Myalgia, unspecified site: Secondary | ICD-10-CM | POA: Diagnosis present

## 2019-03-06 LAB — CBC
HCT: 50 % (ref 39.0–52.0)
Hemoglobin: 16.3 g/dL (ref 13.0–17.0)
MCH: 30 pg (ref 26.0–34.0)
MCHC: 32.6 g/dL (ref 30.0–36.0)
MCV: 91.9 fL (ref 80.0–100.0)
Platelets: 230 10*3/uL (ref 150–400)
RBC: 5.44 MIL/uL (ref 4.22–5.81)
RDW: 12.3 % (ref 11.5–15.5)
WBC: 8.5 10*3/uL (ref 4.0–10.5)
nRBC: 0 % (ref 0.0–0.2)

## 2019-03-06 LAB — COMPREHENSIVE METABOLIC PANEL
ALT: 24 U/L (ref 0–44)
AST: 20 U/L (ref 15–41)
Albumin: 4.2 g/dL (ref 3.5–5.0)
Alkaline Phosphatase: 71 U/L (ref 38–126)
Anion gap: 8 (ref 5–15)
BUN: 16 mg/dL (ref 6–20)
CO2: 27 mmol/L (ref 22–32)
Calcium: 9 mg/dL (ref 8.9–10.3)
Chloride: 107 mmol/L (ref 98–111)
Creatinine, Ser: 1.03 mg/dL (ref 0.61–1.24)
GFR calc Af Amer: >60 mL/min (ref >60)
GFR calc non Af Amer: >60 mL/min (ref >60)
Glucose, Bld: 92 mg/dL (ref 70–99)
Potassium: 3.9 mmol/L (ref 3.5–5.1)
Sodium: 142 mmol/L (ref 135–145)
Total Bilirubin: 0.7 mg/dL (ref 0.3–1.2)
Total Protein: 8.3 g/dL — ABNORMAL HIGH (ref 6.5–8.1)

## 2019-03-06 LAB — LIPASE, BLOOD: Lipase: 25 U/L (ref 11–51)

## 2019-03-06 MED ORDER — ONDANSETRON 4 MG PO TBDP
4.0000 mg | ORAL_TABLET | Freq: Once | ORAL | Status: AC
  Administered 2019-03-06: 4 mg via ORAL
  Filled 2019-03-06: qty 1

## 2019-03-06 MED ORDER — ALUM & MAG HYDROXIDE-SIMETH 200-200-20 MG/5ML PO SUSP
30.0000 mL | Freq: Once | ORAL | Status: AC
  Administered 2019-03-06: 30 mL via ORAL
  Filled 2019-03-06: qty 30

## 2019-03-06 MED ORDER — PANTOPRAZOLE SODIUM 40 MG PO TBEC
40.0000 mg | DELAYED_RELEASE_TABLET | Freq: Every day | ORAL | Status: DC
  Administered 2019-03-06: 19:00:00 40 mg via ORAL
  Filled 2019-03-06: qty 1

## 2019-03-06 MED ORDER — ONDANSETRON 4 MG PO TBDP: 4.0000 mg | ORAL_TABLET | Freq: Three times a day (TID) | ORAL | 0 refills | Status: DC | PRN

## 2019-03-06 MED ORDER — LIDOCAINE VISCOUS HCL 2 % MT SOLN
15.0000 mL | Freq: Once | OROMUCOSAL | Status: AC
  Administered 2019-03-06: 15 mL via ORAL
  Filled 2019-03-06: qty 15

## 2019-03-06 MED ORDER — LIDOCAINE VISCOUS HCL 2 % MT SOLN: 15.0000 mL | OROMUCOSAL | 0 refills | Status: DC | PRN

## 2019-03-06 NOTE — Telephone Encounter (Signed)
Contacted pt due to [place] response of new diarrhea, and poor appetite; he also vomiting x 3 and he noticed streaks of blood in his emesis; the pt says that he vomited so forcefully last pm that he developed a nose bleed; the pt says that he feels that he is getting more dehydrated; he is also having pain in his throat, and it is hard for him swallow water; the pt says that he is going to the ED; agreed with pt that the ED would be a good option because they will have more at their disposal to treat him; he verbalized understanding.

## 2019-03-06 NOTE — ED Notes (Signed)
Patient up to desk and states sitting is causing his pain to increase.  Patient states he may go home or go to another hospital.  This RN apologized for wait.

## 2019-03-06 NOTE — ED Triage Notes (Signed)
PAtient reports "I have had all the corona symptoms" Patient had nose swab completed 4 days ago with negative results. PAtient c/o body aches, fever, chills, sore throat, and emesis. A&O x4

## 2019-03-06 NOTE — Discharge Instructions (Addendum)
You exam and labs are reassuring at this time. Take your previously prescribed antibiotic as directed. Continue to take your home meds as prescribed. Follow-up with your provider as needed. Remain at home under quarantine until your symptoms are improved. You will only be notified via phone of Positive results.

## 2019-03-06 NOTE — ED Provider Notes (Signed)
[facility] Emergency Department Provider Note ____________________________________________  Time seen: 1811  I have reviewed the triage vital signs and the nursing notes.  HISTORY  Chief Complaint  Chills, Generalized Body Aches, Sore Throat, and Emesis  HPI Steven Carrillo is a 33 y.o. male presents himself to the ED today for continued body aches and chills despite a negative pelvic test.  Patient was initially evaluated  approximately 3 days prior, through a virtual visit his provider.  Symptoms at that time included fevers (100.5), chills, sore throat, nausea, and body aches.  He was sent for COVID testing, and reports his test was negative in the interim.  He has been under house quarantine and has been treating his symptoms with over-the-counter medication.  He apparently started on Augmentin following his negative test, to treat empirically for possible strep pharyngitis. He report blood-tinged vomitus and burning from the throat to the epigastric since yesterday. He has a history of esophagitis and GERD, and has not been taking his omeprazole over the last several month.   Past Medical History:  Diagnosis Date  . ADHD (attention deficit hyperactivity disorder)   . Allergies   . Asthma   . Sleep apnea     Patient Active Problem List   Diagnosis Date Noted  . GAD (generalized anxiety disorder) 01/09/2018  . Eosinophilic esophagitis due to food 01/09/2018  . Non-seasonal allergic rhinitis due to pollen 01/09/2018  . Allergic conjunctivitis of both eyes 01/09/2018  . Asthma, intermittent 05/07/2017  . Class 3 severe obesity due to excess calories with serious comorbidity and body mass index (BMI) of 40.0 to 44.9 in adult [facility]) 05/07/2017    Past Surgical History:  Procedure Laterality Date  . ESOPHAGOGASTRODUODENOSCOPY (EGD) WITH PROPOFOL N/A 05/03/2017   Procedure: ESOPHAGOGASTRODUODENOSCOPY (EGD) WITH PROPOFOL;  Surgeon: [name], MD;  Location: [facility]  ENDOSCOPY;  Service: Gastroenterology;  Laterality: N/A;  . FOREIGN BODY REMOVAL N/A 05/03/2017   Procedure: FOREIGN BODY REMOVAL;  Surgeon: [name], MD;  Location: [facility] ENDOSCOPY;  Service: Gastroenterology;  Laterality: N/A;  . MANDIBLE SURGERY    . TOE SURGERY      Prior to Admission medications   Medication Sig Start Date End Date Taking? Authorizing Provider  albuterol (PROVENTIL HFA) 108 (90 Base) MCG/ACT inhaler Inhale 1-2 puffs into the lungs every 6 (six) hours as needed for wheezing or shortness of breath. 04/15/18   Mar Daring, PA-C  amoxicillin-clavulanate (AUGMENTIN) 875-125 MG tablet Take 1 tablet by mouth 2 (two) times daily. 03/05/19   Mar Daring, PA-C  azelastine (ASTELIN) 0.1 % nasal spray  09/19/17   [provider]  azelastine (OPTIVAR) 0.05 % ophthalmic solution Place 1 drop into both eyes 2 (two) times daily. 01/06/18   Mar Daring, PA-C  baclofen (LIORESAL) 10 MG tablet Take 1 tablet (10 mg total) by mouth 3 (three) times daily. 02/20/19   Mar Daring, PA-C  Cetirizine HCl (ZYRTEC ALLERGY) 10 MG CAPS Take by mouth.    [provider]  citalopram (CELEXA) 40 MG tablet Take 1 tablet (40 mg total) by mouth daily. 08/05/18   Ursula Alert, MD  EPINEPHrine 0.3 mg/0.3 mL IJ SOAJ injection USE AS DIRECTED AS NEEDED 04/22/18   [provider]  hydrOXYzine (ATARAX/VISTARIL) 25 MG tablet Take 0.5-1 tablets (12.5-25 mg total) by mouth daily as needed for anxiety. 08/05/18   Ursula Alert, MD  lidocaine (XYLOCAINE) 2 % solution Use as directed 15 mLs in the mouth or throat  every 4 (four) hours as needed for mouth pain. 03/06/19   Dakhari Zuver, Dannielle Karvonen, PA-C  omeprazole (PRILOSEC) 40 MG capsule TAKE 1 CAPSULE DAILY 03/03/19   Mar Daring, PA-C  ondansetron (ZOFRAN ODT) 4 MG disintegrating tablet Take 1 tablet (4 mg total) by mouth every 8 (eight) hours as needed. 03/06/19   Tanielle Emigh, Dannielle Karvonen, PA-C     Allergies Barley grass, Corn-containing products, Dust mite extract, Other, Peanut-containing drug products, Pollen extract, and Tomato  Family History  Problem Relation Age of Onset  . Alcohol abuse Father   . Drug abuse Paternal Uncle     Social History Social History   Tobacco Use  . Smoking status: Never Smoker  . Smokeless tobacco: Never Used  Substance Use Topics  . Alcohol use: Yes    Comment: rare  . Drug use: No    Review of Systems  Constitutional: Negative for fever. Eyes: Negative for visual changes. ENT: Positive for sore throat. Cardiovascular: Negative for chest pain. Respiratory: Negative for shortness of breath. Gastrointestinal: Positive for epigastric abdominal pain, vomiting. Denies diarrhea. Genitourinary: Negative for dysuria. Musculoskeletal: Negative for back pain. Skin: Negative for rash. Neurological: Negative for headaches, focal weakness or numbness. ____________________________________________  PHYSICAL EXAM:  VITAL SIGNS: ED Triage Vitals  Enc Vitals Group     BP 03/06/19 1454 116/71     Pulse Rate 03/06/19 1454 89     Resp 03/06/19 1454 18     Temp 03/06/19 1454 99.5 F (37.5 C)     Temp Source 03/06/19 1454 Oral     SpO2 03/06/19 1454 94 %     Weight 03/06/19 1450 (!) 335 lb (152 kg)     Height 03/06/19 1450 6\' 1"  (1.854 m)     Head Circumference --      Peak Flow --      Pain Score 03/06/19 1450 7     Pain Loc --      Pain Edu? --      Excl. in [place]? --     Constitutional: Alert and oriented. Well appearing and in no distress. Head: Normocephalic and atraumatic. Eyes: Conjunctivae are normal. PERRL. Normal extraocular movements Ears: Canals clear. TMs intact bilaterally. Nose: No congestion/rhinorrhea/epistaxis.   Mouth/Throat: Mucous membranes are moist. Uvula is midline and tonsils are flat.  No oral lesions noted. Neck: Supple. No thyromegaly. Hematological/Lymphatic/Immunological: No cervical  lymphadenopathy. Cardiovascular: Normal rate, regular rhythm. Normal distal pulses. Respiratory: Normal respiratory effort. No wheezes/rales/rhonchi. Gastrointestinal: Soft and mildly tender to palpation over the epigastrium.. No distention, rebound, guarding, or rigidity.  Bowel sounds noted. Musculoskeletal: Nontender with normal range of motion in all extremities.  Neurologic:  Normal gait without ataxia. Normal speech and language. No gross focal neurologic deficits are appreciated. Skin:  Skin is warm, dry and intact. No rash noted. Psychiatric: Mood and affect are normal. Patient exhibits appropriate insight and judgment. ____________________________________________   LABS (pertinent positives/negatives) Labs Reviewed  COMPREHENSIVE METABOLIC PANEL - Abnormal; Notable for the following components:      Result Value   Total Protein 8.3 (*)    All other components within normal limits  SARS CORONAVIRUS 2 (TAT 6-24 HRS)  LIPASE, BLOOD  CBC  ____________________________________________  PROCEDURES  GI Cocktail w/ lidocaine Zofran 4 mg ODT Pantoprazole 40 mg PO Procedures ____________________________________________  INITIAL IMPRESSION / ASSESSMENT AND PLAN / ED COURSE  DDX: acute VGE, GERD, pancreatitis, viral URI, COVID  Patient with ED evaluation of flulike symptoms, with recent vomiting  and blood tinged vomitus.  Patient's clinical picture is overall benign and reassuring.  He is stable his vital signs as well as his labs at this time indication of any acute infectious abdominal process or acute anemia.  No respiratory distress or acute dehydration is demonstrated.  Patient Dems likely represents a mild gastritis/esophagitis given his noncompliance with his PPI.  Patient reports improvement of her symptoms after GI cocktail in the ED.  He is discharged with a prescription for Zofran as well as Viscous Lidocaine to take as directed. He will restart his omeprazole and follow-up  with his provider for ongoing symptoms. He will be notified of his pending COVID results in 24 hours. He will remain out of work based on his PCPs restrictions. Return precautions are reviewed.   Steven Carrillo was evaluated in Emergency Department on 03/06/2019 for the symptoms described in the history of present illness. He was evaluated in the context of the global COVID-19 pandemic, which necessitated consideration that the patient might be at risk for infection with the SARS-CoV-2 virus that causes COVID-19. Institutional protocols and algorithms that pertain to the evaluation of patients at risk for COVID-19 are in a state of rapid change based on information released by regulatory bodies including the CDC and federal and state organizations. These policies and algorithms were followed during the patient's care in the ED. ____________________________________________  FINAL CLINICAL IMPRESSION(S) / ED DIAGNOSES  Final diagnoses:  Esophagitis with gastritis  Nonspecific syndrome suggestive of viral illness      Carmie End, Dannielle Karvonen, PA-C 03/06/19 2312    Merlyn Lot, MD 03/06/19 2351

## 2019-03-06 NOTE — ED Notes (Signed)
See triage note  Presents with subjective fever,sore throat and body aches  States he developed sore throat last week  [place]    States body aches became worse after coming home  Also has had some n/v last pm   Around 11 pm

## 2019-03-07 ENCOUNTER — Encounter (INDEPENDENT_AMBULATORY_CARE_PROVIDER_SITE_OTHER): Payer: Self-pay

## 2019-03-07 ENCOUNTER — Telehealth: Payer: Self-pay | Admitting: *Deleted

## 2019-03-07 ENCOUNTER — Telehealth: Payer: Self-pay

## 2019-03-07 LAB — SARS CORONAVIRUS 2 (TAT 6-24 HRS): SARS Coronavirus 2: NEGATIVE

## 2019-03-07 NOTE — Telephone Encounter (Signed)
TC-follow up on BPA from [place]. Reports he was seen at [facility] emergency department yesterday. Negative covid19 results while there. Prescribed prescription medications for throat/cough/nausea. Discussed with patient the need for rest and fluids until he was feeling better. Reported he had coughed up some blood before the ED visit. Talked about warm salt gargles/using the viscous lidocaine, gi cocktail he was prescribed and to call his pcp on Monday if he continued to cough up blood. Continue to monitor his symptoms.Stated he understood.

## 2019-03-07 NOTE — Telephone Encounter (Signed)
Tried to contact patient by phone but no answer. I Left a vm for patient to callback

## 2019-03-08 ENCOUNTER — Encounter (INDEPENDENT_AMBULATORY_CARE_PROVIDER_SITE_OTHER): Payer: Self-pay

## 2019-03-09 ENCOUNTER — Telehealth: Payer: Self-pay | Admitting: Physician Assistant

## 2019-03-09 ENCOUNTER — Encounter (INDEPENDENT_AMBULATORY_CARE_PROVIDER_SITE_OTHER): Payer: Self-pay

## 2019-03-09 NOTE — Telephone Encounter (Signed)
Please advise 

## 2019-03-09 NOTE — Telephone Encounter (Signed)
Pt called saying he would like to speak to [place] today and find out if he should go back to work tomorrow or later  CB# [phone]  Con Memos

## 2019-03-10 NOTE — Telephone Encounter (Signed)
He has to have three consecutive days fever free. By the record I am seeing today is day 2. He has to be fever free tomorrow and he can return tomorrow.

## 2019-03-10 NOTE — Telephone Encounter (Signed)
I had not received anything through yesterday evening. Will be on the lookout for it.

## 2019-03-10 NOTE — Telephone Encounter (Signed)
Patient was advised. Patient wanted to know if we received a fax from his employer? Please advise?

## 2019-03-11 ENCOUNTER — Encounter (INDEPENDENT_AMBULATORY_CARE_PROVIDER_SITE_OTHER): Payer: Self-pay

## 2019-03-12 ENCOUNTER — Telehealth: Payer: Self-pay | Admitting: Physician Assistant

## 2019-03-12 NOTE — Telephone Encounter (Signed)
1)  Pt needing another note sent to his MyChart that has the date releasing him to go back to work included in the letter.  2)  FMLA paperwork should be faxed to [employer] to fill out for:  1) injury for leg should have been faxed yesterday - should be filled out and returned by 22nd.  2) Illness coming back to work from will be faxed today.  Please call pt back if any questions.  Thanks, [employer]

## 2019-03-12 NOTE — Telephone Encounter (Signed)
The one I got yesterday I filled out for the suspected covid.   The one I get today I will fill out for the leg.  I will send note to release to work.

## 2019-03-13 NOTE — Telephone Encounter (Signed)
Form completed and faxed back

## 2019-03-13 NOTE — Telephone Encounter (Addendum)
Form for illness was completed and faxed back to employer. Patient was advised.

## 2019-03-16 NOTE — Telephone Encounter (Signed)
Forms for the leg was faxed today to [phone]

## 2019-03-16 NOTE — Telephone Encounter (Signed)
The one for covid was faxed on Friday 03/13/19 and the one for the leg will be sent today.

## 2019-03-16 NOTE — Telephone Encounter (Signed)
Pt calling back regarding his FMLA PAPERWORK - Medical Leave.  Pt states he could loose his job if the paperwork is not competed and faxed by tomorrow. Pt has called [employer] to ask them to resend/fax the paperwork.    Pt needing a call back today to let him know if new faxed paperwork was fax and or if the paperwork was completed and faxed.  Please call pt back.  Thanks, [employer]

## 2019-04-24 NOTE — Progress Notes (Signed)
Patient: Steven Carrillo Male    DOB: 07-07-1985   33 y.o.   MRN: [id number] Visit Date: 04/27/2019  Today's Provider: Lavon Paganini, MD   Chief Complaint  Patient presents with  . Mass    Left abdomen and lower right back.   Subjective:     HPI  Pt comes in today reporting finding two "Knots" one on his left abdomen and also left lower back.  Pt states he noticed them about a month ago.  He states they are not painful and are about the same size.  No erythema or drainage. Believes he may have some knots in forearms that are scar tissue from bug bites   Allergies  Allergen Reactions  . Barley Grass   . Corn-Containing Products   . Dust Mite Extract   . Other     Tree grass wheat  . Peanut-Containing Drug Products   . Pollen Extract   . Tomato      Current Outpatient Medications:  .  albuterol (PROVENTIL HFA) 108 (90 Base) MCG/ACT inhaler, Inhale 1-2 puffs into the lungs every 6 (six) hours as needed for wheezing or shortness of breath., Disp: 1 Inhaler, Rfl: 3 .  azelastine (ASTELIN) 0.1 % nasal spray, , Disp: , Rfl: 3 .  azelastine (OPTIVAR) 0.05 % ophthalmic solution, Place 1 drop into both eyes 2 (two) times daily., Disp: 6 mL, Rfl: 12 .  baclofen (LIORESAL) 10 MG tablet, Take 1 tablet (10 mg total) by mouth 3 (three) times daily., Disp: 30 each, Rfl: 0 .  Cetirizine HCl (ZYRTEC ALLERGY) 10 MG CAPS, Take by mouth., Disp: , Rfl:  .  citalopram (CELEXA) 40 MG tablet, Take 1 tablet (40 mg total) by mouth daily., Disp: 90 tablet, Rfl: 0 .  EPINEPHrine 0.3 mg/0.3 mL IJ SOAJ injection, USE AS DIRECTED AS NEEDED, Disp: , Rfl: 1 .  hydrOXYzine (ATARAX/VISTARIL) 25 MG tablet, Take 0.5-1 tablets (12.5-25 mg total) by mouth daily as needed for anxiety., Disp: 90 tablet, Rfl: 0 .  omeprazole (PRILOSEC) 40 MG capsule, TAKE 1 CAPSULE DAILY, Disp: 90 capsule, Rfl: 3 .  amoxicillin-clavulanate (AUGMENTIN) 875-125 MG tablet, Take 1 tablet by mouth 2 (two) times daily., Disp: 20  tablet, Rfl: 0 .  lidocaine (XYLOCAINE) 2 % solution, Use as directed 15 mLs in the mouth or throat every 4 (four) hours as needed for mouth pain. (Patient not taking: Reported on 04/27/2019), Disp: 100 mL, Rfl: 0 .  ondansetron (ZOFRAN ODT) 4 MG disintegrating tablet, Take 1 tablet (4 mg total) by mouth every 8 (eight) hours as needed. (Patient not taking: Reported on 04/27/2019), Disp: 15 tablet, Rfl: 0  Review of Systems  Constitutional: Negative.   Gastrointestinal: Negative.   Neurological: Negative for dizziness, light-headedness and headaches.    Social History   Tobacco Use  . Smoking status: Never Smoker  . Smokeless tobacco: Never Used  Substance Use Topics  . Alcohol use: Yes    Comment: rare      Objective:   Temp 97.7 F (36.5 C) (Temporal)   Wt (!) 339 lb (153.8 kg)   BMI 44.73 kg/m  Vitals:   04/27/19 0807  Temp: 97.7 F (36.5 C)  TempSrc: Temporal  Weight: (!) 339 lb (153.8 kg)  Body mass index is 44.73 kg/m.   Physical Exam Constitutional:      General: He is not in acute distress.    Appearance: Normal appearance. He is obese.  HENT:  Head: Normocephalic and atraumatic.  Eyes:     Conjunctiva/sclera: Conjunctivae normal.  Cardiovascular:     Rate and Rhythm: Normal rate and regular rhythm.     Heart sounds: Normal heart sounds. No murmur.  Pulmonary:     Effort: Pulmonary effort is normal. No respiratory distress.     Breath sounds: Normal breath sounds. No wheezing.  Abdominal:     General: Bowel sounds are normal. There is no distension.     Palpations: Abdomen is soft.     Tenderness: There is no abdominal tenderness.  Skin:    General: Skin is warm and dry.     Findings: No rash.     Comments: +abdominal and flank Striae Pea-sized mobile sub-q lipoma of R flank and 1cm mobile, sub-q lipoma of LLQ abdominal wall. No TTP, no erythema.  Neurological:     Mental Status: He is alert and oriented to person, place, and time. Mental status  is at baseline.  Psychiatric:        Mood and Affect: Mood normal.        Behavior: Behavior normal.      No results found for any visits on 04/27/19.     Assessment & Plan   1. Lipoma of torso - reassurance given about benign nature of lipomas - advised that it may continue to grow - if grows substantially or becomes painful, could consider gen surg referral   Return if symptoms worsen or fail to improve.   The entirety of the information documented in the History of Present Illness, Review of Systems and Physical Exam were personally obtained by me. Portions of this information were initially documented by Ashley Royalty, CMA and reviewed by me for thoroughness and accuracy.    Joi Leyva, Dionne Bucy, MD MPH [facility]

## 2019-04-27 ENCOUNTER — Ambulatory Visit (INDEPENDENT_AMBULATORY_CARE_PROVIDER_SITE_OTHER): Payer: Commercial Managed Care - PPO | Admitting: Family Medicine

## 2019-04-27 ENCOUNTER — Encounter: Payer: Self-pay | Admitting: Family Medicine

## 2019-04-27 ENCOUNTER — Other Ambulatory Visit: Payer: Self-pay

## 2019-04-27 VITALS — BP 134/85 | HR 72 | Temp 97.7°F | Wt 339.0 lb

## 2019-04-27 DIAGNOSIS — D171 Benign lipomatous neoplasm of skin and subcutaneous tissue of trunk: Secondary | ICD-10-CM | POA: Diagnosis not present

## 2019-04-27 NOTE — Patient Instructions (Signed)
Lipoma  A lipoma is a noncancerous (benign) tumor that is made up of fat cells. This is a very common type of soft-tissue growth. Lipomas are usually found under the skin (subcutaneous). They may occur in any tissue of the body that contains fat. Common areas for lipomas to appear include the back, shoulders, buttocks, and thighs.  Lipomas grow slowly, and they are usually painless. Most lipomas do not cause problems and do not require treatment. What are the causes? The cause of this condition is not known. What increases the risk? You are more likely to develop this condition if:  You are 40-60 years old.  You have a family history of lipomas. What are the signs or symptoms? A lipoma usually appears as a small, round bump under the skin. In most cases, the lump will:  Feel soft or rubbery.  Not cause pain or other symptoms. However, if a lipoma is located in an area where it pushes on nerves, it can become painful or cause other symptoms. How is this diagnosed? A lipoma can usually be diagnosed with a physical exam. You may also have tests to confirm the diagnosis and to rule out other conditions. Tests may include:  Imaging tests, such as a CT scan or MRI.  Removal of a tissue sample to be looked at under a microscope (biopsy). How is this treated? Treatment for this condition depends on the size of the lipoma and whether it is causing any symptoms.  For small lipomas that are not causing problems, no treatment is needed.  If a lipoma is bigger or it causes problems, surgery may be done to remove the lipoma. Lipomas can also be removed to improve appearance. Most often, the procedure is done after applying a medicine that numbs the area (local anesthetic). Follow these instructions at home:  Watch your lipoma for any changes.  Keep all follow-up visits as told by your health care provider. This is important. Contact a health care provider if:  Your lipoma becomes larger or  hard.  Your lipoma becomes painful, red, or increasingly swollen. These could be signs of infection or a more serious condition. Get help right away if:  You develop tingling or numbness in an area near the lipoma. This could indicate that the lipoma is causing nerve damage. Summary  A lipoma is a noncancerous tumor that is made up of fat cells.  Most lipomas do not cause problems and do not require treatment.  If a lipoma is bigger or it causes problems, surgery may be done to remove the lipoma. This information is not intended to replace advice given to you by your health care provider. Make sure you discuss any questions you have with your health care provider. Document Released: 06/01/2002 Document Revised: 05/28/2017 Document Reviewed: 05/28/2017 Elsevier Patient Education  2020 Elsevier Inc[employer]  

## 2019-07-15 ENCOUNTER — Telehealth: Payer: Self-pay

## 2019-07-15 DIAGNOSIS — L089 Local infection of the skin and subcutaneous tissue, unspecified: Secondary | ICD-10-CM

## 2019-07-15 NOTE — Telephone Encounter (Signed)
To my knowledge we do not do those, nor are we capable of doing them due to staffing and I am not sure if we keep epipens in office.

## 2019-07-15 NOTE — Telephone Encounter (Signed)
   Copied from [place] [id number]. Topic: Appointment Scheduling - Scheduling Inquiry for Clinic >> Jul 15, 2019 10:37 AM Nils Flack wrote: Reason for CRM: pt is calling to fins out If he can get his allergy shots through his pcp.  He can no longer go to [facility] for shots. He can not get there in time due to their schedule change and his work schedule. The allergist can ship the shots to the pcp office. Cb is [phone]

## 2019-07-16 MED ORDER — CEPHALEXIN 500 MG PO CAPS: 500.0000 mg | ORAL_CAPSULE | Freq: Two times a day (BID) | ORAL | 0 refills | Status: DC

## 2019-07-16 NOTE — Telephone Encounter (Signed)
Pt advised.   Thanks,   -Jacy Howat  

## 2019-07-16 NOTE — Telephone Encounter (Signed)
Keflex sent in.

## 2019-07-16 NOTE — Addendum Note (Signed)
Addended by: Mar Daring on: 07/16/2019 10:59 AM   Modules accepted: Orders

## 2019-07-16 NOTE — Telephone Encounter (Signed)
Pt advised.  He also mentioned he has an infection in his belly button again.  He says he has been see before for this.  He would like an antibiotic sent to [place] if possible.   Thanks,   -[name]

## 2019-08-01 IMAGING — CR DG CHEST 2V
1 series · 2 of 2 positions shown · non-contrast
Comparison: None.

CLINICAL DATA: Esophageal foreign body.

EXAM:
CHEST  2 VIEW

[Series 1: dg chest 2 view · 0.14mm/px · 2 of 2 slices shown]
[im 1/2]
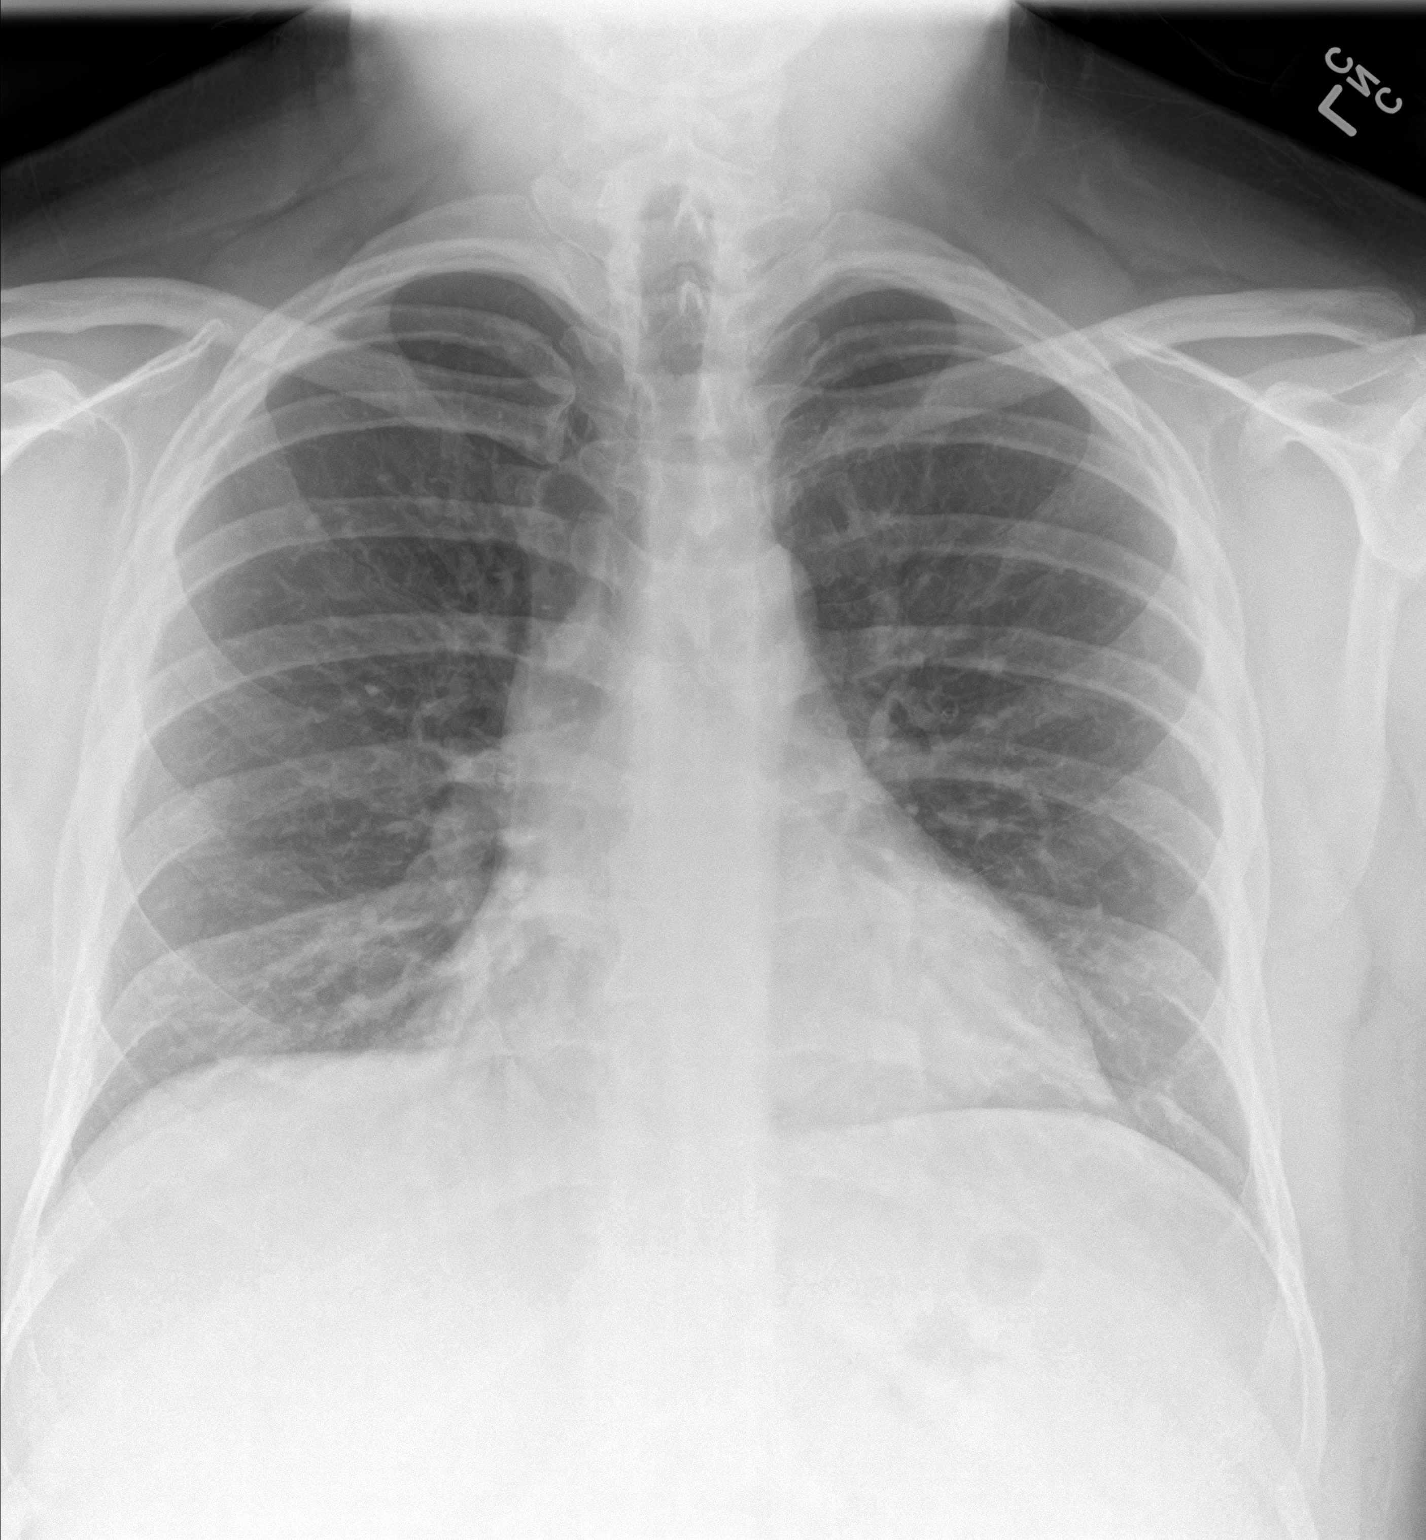
[im 2/2]
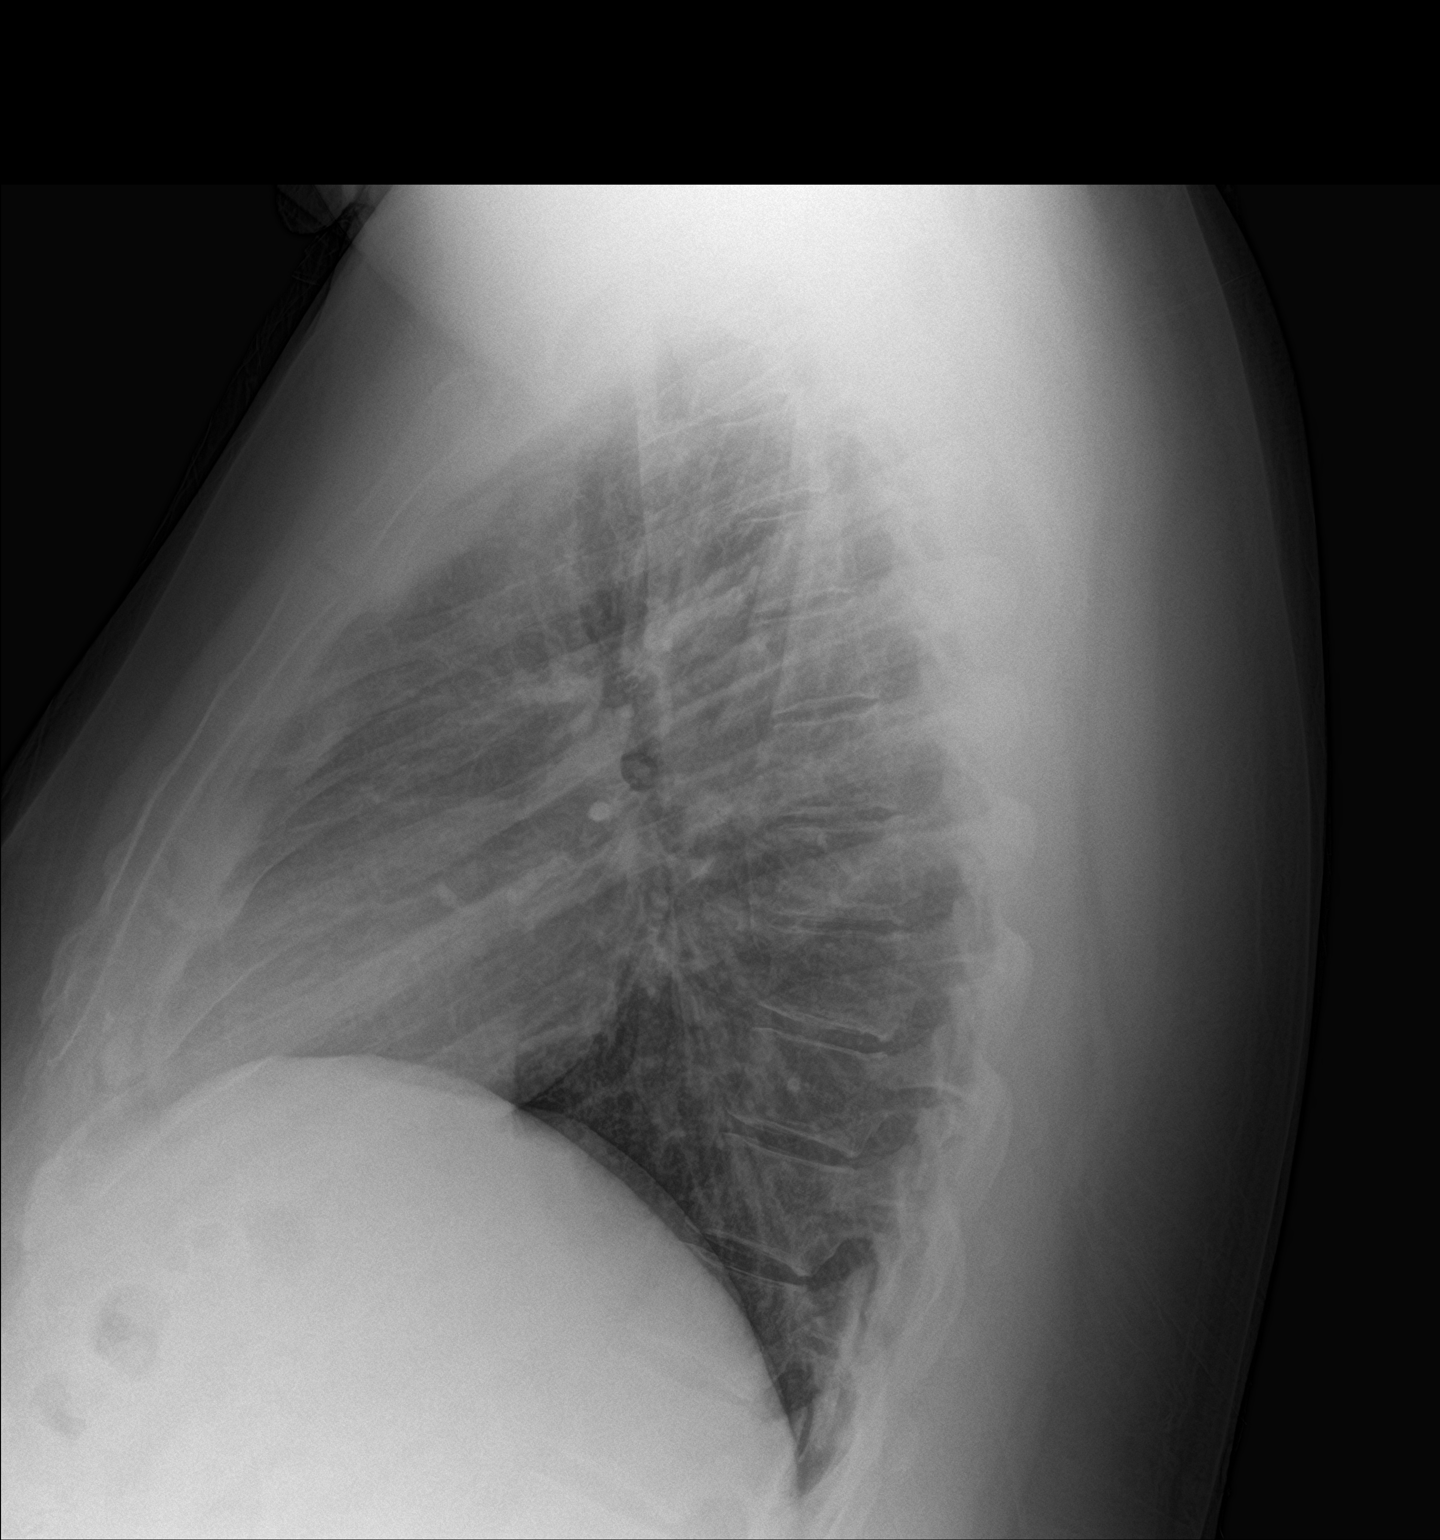

[2 of 2 positions shown; findings below may reference images not displayed]

FINDINGS: The heart size and mediastinal contours are within normal limits.
Both lungs are clear. No evidence of aspiration. No apparent
radiopaque foreign body. No esophageal dilatation. The visualized
skeletal structures are unremarkable.
IMPRESSION: No active cardiopulmonary disease. No radiopaque foreign body
identified. No evidence of aspiration.

## 2019-09-13 ENCOUNTER — Encounter: Payer: Self-pay | Admitting: Physician Assistant

## 2019-09-14 ENCOUNTER — Ambulatory Visit (INDEPENDENT_AMBULATORY_CARE_PROVIDER_SITE_OTHER): Payer: Commercial Managed Care - PPO | Admitting: Physician Assistant

## 2019-09-14 ENCOUNTER — Encounter: Payer: Self-pay | Admitting: Physician Assistant

## 2019-09-14 DIAGNOSIS — J0140 Acute pansinusitis, unspecified: Secondary | ICD-10-CM

## 2019-09-14 MED ORDER — AMOXICILLIN-POT CLAVULANATE 875-125 MG PO TABS: 1.0000 | ORAL_TABLET | Freq: Two times a day (BID) | ORAL | 0 refills | Status: DC

## 2019-09-14 NOTE — Patient Instructions (Signed)
Sinusitis, Adult Sinusitis is inflammation of your sinuses. Sinuses are hollow spaces in the bones around your face. Your sinuses are located:  Around your eyes.  In the middle of your forehead.  Behind your nose.  In your cheekbones. Mucus normally drains out of your sinuses. When your nasal tissues become inflamed or swollen, mucus can become trapped or blocked. This allows bacteria, viruses, and fungi to grow, which leads to infection. Most infections of the sinuses are caused by a virus. Sinusitis can develop quickly. It can last for up to 4 weeks (acute) or for more than 12 weeks (chronic). Sinusitis often develops after a cold. What are the causes? This condition is caused by anything that creates swelling in the sinuses or stops mucus from draining. This includes:  Allergies.  Asthma.  Infection from bacteria or viruses.  Deformities or blockages in your nose or sinuses.  Abnormal growths in the nose (nasal polyps).  Pollutants, such as chemicals or irritants in the air.  Infection from fungi (rare). What increases the risk? You are more likely to develop this condition if you:  Have a weak body defense system (immune system).  Do a lot of swimming or diving.  Overuse nasal sprays.  Smoke. What are the signs or symptoms? The main symptoms of this condition are pain and a feeling of pressure around the affected sinuses. Other symptoms include:  Stuffy nose or congestion.  Thick drainage from your nose.  Swelling and warmth over the affected sinuses.  Headache.  Upper toothache.  A cough that may get worse at night.  Extra mucus that collects in the throat or the back of the nose (postnasal drip).  Decreased sense of smell and taste.  Fatigue.  A fever.  Sore throat.  Bad breath. How is this diagnosed? This condition is diagnosed based on:  Your symptoms.  Your medical history.  A physical exam.  Tests to find out if your condition is  acute or chronic. This may include: ? Checking your nose for nasal polyps. ? Viewing your sinuses using a device that has a light (endoscope). ? Testing for allergies or bacteria. ? Imaging tests, such as an MRI or CT scan. In rare cases, a bone biopsy may be done to rule out more serious types of fungal sinus disease. How is this treated? Treatment for sinusitis depends on the cause and whether your condition is chronic or acute.  If caused by a virus, your symptoms should go away on their own within 10 days. You may be given medicines to relieve symptoms. They include: ? Medicines that shrink swollen nasal passages (topical intranasal decongestants). ? Medicines that treat allergies (antihistamines). ? A spray that eases inflammation of the nostrils (topical intranasal corticosteroids). ? Rinses that help get rid of thick mucus in your nose (nasal saline washes).  If caused by bacteria, your health care provider may recommend waiting to see if your symptoms improve. Most bacterial infections will get better without antibiotic medicine. You may be given antibiotics if you have: ? A severe infection. ? A weak immune system.  If caused by narrow nasal passages or nasal polyps, you may need to have surgery. Follow these instructions at home: Medicines  Take, use, or apply over-the-counter and prescription medicines only as told by your health care provider. These may include nasal sprays.  If you were prescribed an antibiotic medicine, take it as told by your health care provider. Do not stop taking the antibiotic even if you start   to feel better. Hydrate and humidify   Drink enough fluid to keep your urine pale yellow. Staying hydrated will help to thin your mucus.  Use a cool mist humidifier to keep the humidity level in your home above 50%.  Inhale steam for 10-15 minutes, 3-4 times a day, or as told by your health care provider. You can do this in the bathroom while a hot shower is  running.  Limit your exposure to cool or dry air. Rest  Rest as much as possible.  Sleep with your head raised (elevated).  Make sure you get enough sleep each night. General instructions   Apply a warm, moist washcloth to your face 3-4 times a day or as told by your health care provider. This will help with discomfort.  Wash your hands often with soap and water to reduce your exposure to germs. If soap and water are not available, use hand sanitizer.  Do not smoke. Avoid being around people who are smoking (secondhand smoke).  Keep all follow-up visits as told by your health care provider. This is important. Contact a health care provider if:  You have a fever.  Your symptoms get worse.  Your symptoms do not improve within 10 days. Get help right away if:  You have a severe headache.  You have persistent vomiting.  You have severe pain or swelling around your face or eyes.  You have vision problems.  You develop confusion.  Your neck is stiff.  You have trouble breathing. Summary  Sinusitis is soreness and inflammation of your sinuses. Sinuses are hollow spaces in the bones around your face.  This condition is caused by nasal tissues that become inflamed or swollen. The swelling traps or blocks the flow of mucus. This allows bacteria, viruses, and fungi to grow, which leads to infection.  If you were prescribed an antibiotic medicine, take it as told by your health care provider. Do not stop taking the antibiotic even if you start to feel better.  Keep all follow-up visits as told by your health care provider. This is important. This information is not intended to replace advice given to you by your health care provider. Make sure you discuss any questions you have with your health care provider. Document Revised: 11/11/2017 Document Reviewed: 11/11/2017 Elsevier Patient Education  2020 Elsevier Inc.  

## 2019-09-14 NOTE — Progress Notes (Signed)
Patient: Steven Carrillo Male    DOB: [birth year]   34 y.o.   MRN: [id number] Visit Date: 09/14/2019  Today's Provider: Mar Daring, PA-C   Chief Complaint  Patient presents with  . Sinus Problem   Subjective:     Virtual Visit via Telephone Note  I connected with Steven Carrillo on 09/14/19 at  6:20 PM EDT by telephone and verified that I am speaking with the correct person using two identifiers.  Location: Patient: Home Provider: [facility]   I discussed the limitations, risks, security and privacy concerns of performing an evaluation and management service by telephone and the availability of in person appointments. I also discussed with the patient that there may be a patient responsible charge related to this service. The patient expressed understanding and agreed to proceed.   Sinus Problem This is a new problem. The current episode started in the past 7 days. The problem has been gradually worsening since onset. There has been no fever. Associated symptoms include congestion, coughing, shortness of breath ("a little" because of "asthma") and sinus pressure (all right sided maxillary and frontal congestion and pain per patient). Pertinent negatives include no chills, ear pain or headaches. Sore throat: "scratchy" Past treatments include nothing.    Allergies  Allergen Reactions  . Barley Grass   . Corn-Containing Products   . Dust Mite Extract   . Other     Tree grass wheat  . Peanut-Containing Drug Products   . Pollen Extract   . Tomato      Current Outpatient Medications:  .  albuterol (PROVENTIL HFA) 108 (90 Base) MCG/ACT inhaler, Inhale 1-2 puffs into the lungs every 6 (six) hours as needed for wheezing or shortness of breath., Disp: 1 Inhaler, Rfl: 3 .  azelastine (ASTELIN) 0.1 % nasal spray, , Disp: , Rfl: 3 .  azelastine (OPTIVAR) 0.05 % ophthalmic solution, Place 1 drop into both eyes 2 (two) times daily., Disp: 6 mL, Rfl: 12 .  baclofen (LIORESAL) 10 MG  tablet, Take 1 tablet (10 mg total) by mouth 3 (three) times daily., Disp: 30 each, Rfl: 0 .  Cetirizine HCl (ZYRTEC ALLERGY) 10 MG CAPS, Take by mouth., Disp: , Rfl:  .  citalopram (CELEXA) 40 MG tablet, Take 1 tablet (40 mg total) by mouth daily., Disp: 90 tablet, Rfl: 0 .  EPINEPHrine 0.3 mg/0.3 mL IJ SOAJ injection, USE AS DIRECTED AS NEEDED, Disp: , Rfl: 1 .  hydrOXYzine (ATARAX/VISTARIL) 25 MG tablet, Take 0.5-1 tablets (12.5-25 mg total) by mouth daily as needed for anxiety., Disp: 90 tablet, Rfl: 0 .  omeprazole (PRILOSEC) 40 MG capsule, TAKE 1 CAPSULE DAILY, Disp: 90 capsule, Rfl: 3 .  amoxicillin-clavulanate (AUGMENTIN) 875-125 MG tablet, Take 1 tablet by mouth 2 (two) times daily., Disp: 20 tablet, Rfl: 0  Review of Systems  Constitutional: Negative for chills, fatigue and fever.  HENT: Positive for congestion, postnasal drip, sinus pressure (all right sided maxillary and frontal congestion and pain per patient) and sinus pain. Negative for ear pain. Sore throat: "scratchy"   Respiratory: Positive for cough and shortness of breath ("a little" because of "asthma").   Cardiovascular: Negative for chest pain, palpitations and leg swelling.  Gastrointestinal: Negative for abdominal pain and nausea.  Neurological: Negative for dizziness and headaches.    Social History   Tobacco Use  . Smoking status: Never Smoker  . Smokeless tobacco: Never Used  Substance Use Topics  . Alcohol use: Yes  Comment: rare      Objective:   There were no vitals taken for this visit. There were no vitals filed for this visit.There is no height or weight on file to calculate BMI.   Physical Exam Vitals reviewed.  Constitutional:      General: He is not in acute distress. Pulmonary:     Effort: No respiratory distress (able to talk in full sentences; no cough heard).  Neurological:     Mental Status: He is alert.      No results found for any visits on 09/14/19.     Assessment & Plan       1. Acute non-recurrent pansinusitis Worsening symptoms that have not responded to OTC medications. Will give augmentin as below. Continue allergy medications. Stay well hydrated and get plenty of rest. Call if no symptom improvement or if symptoms worsen. - amoxicillin-clavulanate (AUGMENTIN) 875-125 MG tablet; Take 1 tablet by mouth 2 (two) times daily.  Dispense: 20 tablet; Refill: 0   I discussed the assessment and treatment plan with the patient. The patient was provided an opportunity to ask questions and all were answered. The patient agreed with the plan and demonstrated an understanding of the instructions.   The patient was advised to call back or seek an in-person evaluation if the symptoms worsen or if the condition fails to improve as anticipated.  I provided 8 minutes of non-face-to-face time during this encounter.    Mar Daring, PA-C  [facility]

## 2019-09-16 ENCOUNTER — Encounter: Payer: Self-pay | Admitting: Physician Assistant

## 2019-10-15 ENCOUNTER — Ambulatory Visit: Payer: Commercial Managed Care - PPO | Admitting: Physician Assistant

## 2019-10-15 ENCOUNTER — Telehealth: Payer: Self-pay

## 2019-10-15 ENCOUNTER — Other Ambulatory Visit: Payer: Self-pay

## 2019-10-15 ENCOUNTER — Encounter: Payer: Self-pay | Admitting: Physician Assistant

## 2019-10-15 VITALS — BP 130/89 | HR 72 | Temp 97.5°F | Resp 16 | Wt 364.3 lb

## 2019-10-15 DIAGNOSIS — Z6841 Body Mass Index (BMI) 40.0 and over, adult: Secondary | ICD-10-CM | POA: Diagnosis not present

## 2019-10-15 DIAGNOSIS — Z9989 Dependence on other enabling machines and devices: Secondary | ICD-10-CM

## 2019-10-15 DIAGNOSIS — H66001 Acute suppurative otitis media without spontaneous rupture of ear drum, right ear: Secondary | ICD-10-CM | POA: Diagnosis not present

## 2019-10-15 DIAGNOSIS — G4733 Obstructive sleep apnea (adult) (pediatric): Secondary | ICD-10-CM | POA: Diagnosis not present

## 2019-10-15 MED ORDER — NEOMYCIN-POLYMYXIN-HC 1 % OT SOLN: 3.0000 [drp] | Freq: Four times a day (QID) | OTIC | 0 refills | Status: DC

## 2019-10-15 MED ORDER — AMOXICILLIN-POT CLAVULANATE 875-125 MG PO TABS: 1.0000 | ORAL_TABLET | Freq: Two times a day (BID) | ORAL | 0 refills | Status: DC

## 2019-10-15 NOTE — Telephone Encounter (Signed)
It is okay for the pt to come into the office?  Thanks,   -[name]

## 2019-10-15 NOTE — Patient Instructions (Signed)

## 2019-10-15 NOTE — Telephone Encounter (Signed)
appt scheduled this evening at 6:20. KW

## 2019-10-15 NOTE — Progress Notes (Signed)
Established patient visit   Patient: Steven Carrillo   DOB: [birth year]   34 y.o. Male  MRN: [id number] Visit Date: 10/15/2019  Today's healthcare provider: Mar Daring, PA-C   Chief Complaint  Patient presents with  . Otalgia   Subjective    HPI Patient here with c/o right ear pain. He has some ringing and it feels clogged.Onset was last night around 8o'clock. He has tried ear pain drops last night but they only worked for a few hours. All right side of his face is hurting now. Took Acetaminophen this morning.  He reports that he has been having trouble sleeping for 3 months. Feels like his Cpap machine air pressure not working well. Has gained over 30 pounds over the last 6-7 months.   Obesity: Reports that he is not losing weight, reports that he at least walks 10 miles everyday at work. Repors that his diet is healthy sometimes and not healthy. Did a keto type diet last year and reports he lost 30 pounds but as soon as he stopped it all came back. Stopped because no one else in the house was doing it so it made it difficult to maintain. Has tried many weight loss supplements and medications in the past and failed. Requesting bariatric consult.    Wt Readings from Last 3 Encounters:  10/15/19 (!) 364 lb 4.8 oz (165.2 kg)  04/27/19 (!) 339 lb (153.8 kg)  03/06/19 (!) 335 lb (152 kg)    Patient Active Problem List   Diagnosis Date Noted  . GAD (generalized anxiety disorder) 01/09/2018  . Eosinophilic esophagitis due to food 01/09/2018  . Non-seasonal allergic rhinitis due to pollen 01/09/2018  . Allergic conjunctivitis of both eyes 01/09/2018  . Asthma, intermittent 05/07/2017  . Class 3 severe obesity due to excess calories with serious comorbidity and body mass index (BMI) of 40.0 to 44.9 in adult [facility]) 05/07/2017   Past Medical History:  Diagnosis Date  . ADHD (attention deficit hyperactivity disorder)   . Allergies   . Asthma   . Sleep apnea    Allergies    Allergen Reactions  . Barley Grass   . Corn-Containing Products   . Dust Mite Extract   . Other     Tree grass wheat  . Peanut-Containing Drug Products   . Pollen Extract   . Tomato        Medications: Outpatient Medications Prior to Visit  Medication Sig  . albuterol (PROVENTIL HFA) 108 (90 Base) MCG/ACT inhaler Inhale 1-2 puffs into the lungs every 6 (six) hours as needed for wheezing or shortness of breath.  Marland Kitchen azelastine (OPTIVAR) 0.05 % ophthalmic solution Place 1 drop into both eyes 2 (two) times daily.  . Cetirizine HCl (ZYRTEC ALLERGY) 10 MG CAPS Take by mouth.  . citalopram (CELEXA) 40 MG tablet Take 1 tablet (40 mg total) by mouth daily.  Marland Kitchen EPINEPHrine 0.3 mg/0.3 mL IJ SOAJ injection USE AS DIRECTED AS NEEDED  . hydrOXYzine (ATARAX/VISTARIL) 25 MG tablet Take 0.5-1 tablets (12.5-25 mg total) by mouth daily as needed for anxiety.  Marland Kitchen omeprazole (PRILOSEC) 40 MG capsule TAKE 1 CAPSULE DAILY  . amoxicillin-clavulanate (AUGMENTIN) 875-125 MG tablet Take 1 tablet by mouth 2 (two) times daily.  Marland Kitchen azelastine (ASTELIN) 0.1 % nasal spray   . baclofen (LIORESAL) 10 MG tablet Take 1 tablet (10 mg total) by mouth 3 (three) times daily. (Patient not taking: Reported on 10/15/2019)   No facility-administered medications prior to visit.  Review of Systems  Constitutional: Positive for fatigue.  HENT: Positive for ear pain, hearing loss (muffled on right) and tinnitus (right). Negative for congestion, ear discharge, postnasal drip, rhinorrhea, sinus pressure, sinus pain and trouble swallowing.   Respiratory: Positive for apnea. Negative for cough, shortness of breath and wheezing.   Cardiovascular: Negative for chest pain, palpitations and leg swelling.  Gastrointestinal: Negative for abdominal pain.  Musculoskeletal: Positive for arthralgias (knees).  Neurological: Positive for headaches.    Last CBC Lab Results  Component Value Date   WBC 8.5 03/06/2019   HGB 16.3 03/06/2019    HCT 50.0 03/06/2019   MCV 91.9 03/06/2019   MCH 30.0 03/06/2019   RDW 12.3 03/06/2019   PLT 230 [id number]   Last metabolic panel Lab Results  Component Value Date   GLUCOSE 92 03/06/2019   NA 142 03/06/2019   K 3.9 03/06/2019   CL 107 03/06/2019   CO2 27 03/06/2019   BUN 16 03/06/2019   CREATININE 1.03 03/06/2019   GFRNONAA >60 03/06/2019   GFRAA >60 03/06/2019   CALCIUM 9.0 03/06/2019   PROT 8.3 (H) 03/06/2019   ALBUMIN 4.2 03/06/2019   LABGLOB 2.9 09/03/2018   AGRATIO 1.5 09/03/2018   BILITOT 0.7 03/06/2019   ALKPHOS 71 03/06/2019   AST 20 03/06/2019   ALT 24 03/06/2019   ANIONGAP 8 03/06/2019   Last lipids Lab Results  Component Value Date   CHOL 215 (H) 09/03/2018   HDL 30 (L) 09/03/2018   LDLCALC 136 (H) 09/03/2018   TRIG 244 (H) 09/03/2018   CHOLHDL 7.2 (H) 09/03/2018   Last hemoglobin A1c Lab Results  Component Value Date   HGBA1C 5.7 (H) 09/03/2018   Last thyroid functions Lab Results  Component Value Date   TSH 0.847 09/03/2018       Objective    BP 130/89 (BP Location: Left Arm, Patient Position: Sitting, Cuff Size: Large)   Pulse 72   Temp (!) 97.5 F (36.4 C) (Temporal)   Resp 16   Wt (!) 364 lb 4.8 oz (165.2 kg)   BMI 48.06 kg/m  BP Readings from Last 3 Encounters:  10/15/19 130/89  04/27/19 134/85  03/06/19 113/68   Wt Readings from Last 3 Encounters:  10/15/19 (!) 364 lb 4.8 oz (165.2 kg)  04/27/19 (!) 339 lb (153.8 kg)  03/06/19 (!) 335 lb (152 kg)      Physical Exam Vitals reviewed.  Constitutional:      General: He is not in acute distress.    Appearance: Normal appearance. He is well-developed. He is obese. He is not ill-appearing or diaphoretic.  HENT:     Head: Normocephalic and atraumatic.     Right Ear: Hearing, ear canal and external ear normal. Swelling and tenderness present. No middle ear effusion. Tympanic membrane is erythematous. Tympanic membrane is not bulging.     Left Ear: Hearing, tympanic membrane,  ear canal and external ear normal.  No middle ear effusion. Tympanic membrane is not erythematous or bulging.     Nose: Mucosal edema present. No rhinorrhea.     Right Sinus: No maxillary sinus tenderness or frontal sinus tenderness.     Left Sinus: No maxillary sinus tenderness or frontal sinus tenderness.     Mouth/Throat:     Pharynx: Uvula midline.  Eyes:     General: No scleral icterus.       Right eye: No discharge.        Left eye: No discharge.  Extraocular Movements: Extraocular movements intact.     Conjunctiva/sclera: Conjunctivae normal.     Pupils: Pupils are equal, round, and reactive to light.  Neck:     Thyroid: No thyromegaly.     Trachea: No tracheal deviation.     Meningeal: Brudzinski's sign and Kernig's sign absent.  Cardiovascular:     Rate and Rhythm: Normal rate and regular rhythm.     Pulses: Normal pulses.     Heart sounds: Normal heart sounds. No murmur. No friction rub. No gallop.   Pulmonary:     Effort: Pulmonary effort is normal. No respiratory distress.     Breath sounds: Normal breath sounds. No stridor. No wheezing or rales.  Musculoskeletal:     Cervical back: Normal range of motion and neck supple.  Lymphadenopathy:     Cervical: No cervical adenopathy.  Skin:    General: Skin is warm and dry.  Neurological:     Mental Status: He is alert.       No results found for any visits on 10/15/19.  Assessment & Plan    1. Non-recurrent acute suppurative otitis media of right ear without spontaneous rupture of tympanic membrane Worsening symptoms that have not responded to OTC medications. Will give augmentin as below. Also will give cortisporin drops for the steroid to reduce inflammation and hopefully reduce pain as well. May use IBU and/or tylenol. Continue allergy medications. Stay well hydrated and get plenty of rest. Call if no symptom improvement or if symptoms worsen. - amoxicillin-clavulanate (AUGMENTIN) 875-125 MG tablet; Take 1 tablet  by mouth 2 (two) times daily.  Dispense: 20 tablet; Refill: 0 - NEOMYCIN-POLYMYXIN-HYDROCORTISONE (CORTISPORIN) 1 % SOLN OTIC solution; Place 3 drops into the right ear 4 (four) times daily. X 5 days  Dispense: 10 mL; Refill: 0  2. Class 3 severe obesity due to excess calories with serious comorbidity and body mass index (BMI) of 40.0 to 44.9 in adult [facility]) Failed multiple weight loss methods. At highest adult weight ever. Referral for bariatric consult placed and appreciated.  - Amb Referral to Bariatric Surgery  3. OSA on CPAP Reports increased daytime fatigue and issues sleeping again. Has gained 30 pounds in short period of time. Prescription written and faxed to [facility] allowing the change of pressures. He may require a new CPAP titration since he has had weight gain.  - Amb Referral to Bariatric Surgery   No follow-ups on file.      Reynolds Bowl, PA-C, have reviewed all documentation for this visit. The documentation on 10/16/19 for the exam, diagnosis, procedures, and orders are all accurate and complete.   Rubye Beach  [facility] [phone] (phone) [phone] (fax)  [place]

## 2019-10-15 NOTE — Telephone Encounter (Signed)
Copied from [place] [phone]. Topic: Appointment Scheduling - Scheduling Inquiry for Clinic >> Oct 15, 2019 10:47 AM Erick Blinks wrote: Reason for CRM: Pt wants to come into the office today, has right ear pain/ringing/clogged. Please advise if Virtual Appt is necessary, pt really wants to come into the office. [phone]

## 2019-10-15 NOTE — Telephone Encounter (Signed)
Yes he can come in. Most likely wax

## 2019-11-09 ENCOUNTER — Encounter: Payer: Self-pay | Admitting: Physician Assistant

## 2019-11-09 NOTE — Telephone Encounter (Signed)
OV or telephone visit. Last time medicine prescribed ~ 1 year ago.

## 2019-12-21 ENCOUNTER — Ambulatory Visit (INDEPENDENT_AMBULATORY_CARE_PROVIDER_SITE_OTHER): Payer: Commercial Managed Care - PPO | Admitting: Physician Assistant

## 2019-12-21 ENCOUNTER — Encounter: Payer: Self-pay | Admitting: Physician Assistant

## 2019-12-21 ENCOUNTER — Other Ambulatory Visit: Payer: Self-pay

## 2019-12-21 VITALS — BP 138/84 | HR 83 | Temp 97.3°F | Resp 16 | Wt 365.0 lb

## 2019-12-21 DIAGNOSIS — R7309 Other abnormal glucose: Secondary | ICD-10-CM | POA: Diagnosis not present

## 2019-12-21 DIAGNOSIS — Z6841 Body Mass Index (BMI) 40.0 and over, adult: Secondary | ICD-10-CM

## 2019-12-21 DIAGNOSIS — M62830 Muscle spasm of back: Secondary | ICD-10-CM

## 2019-12-21 LAB — POCT GLYCOSYLATED HEMOGLOBIN (HGB A1C)
Est. average glucose Bld gHb Est-mCnc: 148
Hemoglobin A1C: 6.8 % — AB (ref 4.0–5.6)

## 2019-12-21 MED ORDER — CYCLOBENZAPRINE HCL 5 MG PO TABS: 5.0000 mg | ORAL_TABLET | Freq: Three times a day (TID) | ORAL | 1 refills | Status: DC | PRN

## 2019-12-21 NOTE — Progress Notes (Signed)
Established patient visit   Patient: Steven Carrillo   DOB: 10/26/1985   34 y.o. Male  MRN: [id number] Visit Date: 12/21/2019  Today's healthcare provider: Mar Daring, PA-C   Chief Complaint  Patient presents with  . Diabetes   Subjective    HPI Patient here with diabetic concerns. His last A1C was 5.7. He denies polyuria, polydipsia. Reports his diet is unhealthy. Has been having episodes where he feels his "sugar drops". Does not occur frequently and is most often noticed when he has not eaten or had anything to drink for over a few hours while working. He reports he will feel shaky and weak. Has gained significant weight back as his wife is pregnant. He also reports they do eat out almost daily.    Patient Active Problem List   Diagnosis Date Noted  . GAD (generalized anxiety disorder) 01/09/2018  . Eosinophilic esophagitis due to food 01/09/2018  . Non-seasonal allergic rhinitis due to pollen 01/09/2018  . Allergic conjunctivitis of both eyes 01/09/2018  . Asthma, intermittent 05/07/2017  . Class 3 severe obesity due to excess calories with serious comorbidity and body mass index (BMI) of 40.0 to 44.9 in adult [facility]) 05/07/2017   Past Medical History:  Diagnosis Date  . ADHD (attention deficit hyperactivity disorder)   . Allergies   . Asthma   . Sleep apnea        Medications: Outpatient Medications Prior to Visit  Medication Sig  . albuterol (PROVENTIL HFA) 108 (90 Base) MCG/ACT inhaler Inhale 1-2 puffs into the lungs every 6 (six) hours as needed for wheezing or shortness of breath.  Marland Kitchen azelastine (OPTIVAR) 0.05 % ophthalmic solution Place 1 drop into both eyes 2 (two) times daily.  . Cetirizine HCl (ZYRTEC ALLERGY) 10 MG CAPS Take by mouth.  . citalopram (CELEXA) 40 MG tablet Take 1 tablet (40 mg total) by mouth daily.  Marland Kitchen EPINEPHrine 0.3 mg/0.3 mL IJ SOAJ injection USE AS DIRECTED AS NEEDED  . hydrOXYzine (ATARAX/VISTARIL) 25 MG tablet Take 0.5-1 tablets  (12.5-25 mg total) by mouth daily as needed for anxiety.  . NEOMYCIN-POLYMYXIN-HYDROCORTISONE (CORTISPORIN) 1 % SOLN OTIC solution Place 3 drops into the right ear 4 (four) times daily. X 5 days  . omeprazole (PRILOSEC) 40 MG capsule TAKE 1 CAPSULE DAILY  . [DISCONTINUED] amoxicillin-clavulanate (AUGMENTIN) 875-125 MG tablet Take 1 tablet by mouth 2 (two) times daily.   No facility-administered medications prior to visit.    Review of Systems  Constitutional: Negative for diaphoresis, fatigue, fever and unexpected weight change.  Eyes: Negative for visual disturbance.  Respiratory: Negative for cough, chest tightness and shortness of breath.   Cardiovascular: Negative for chest pain, palpitations and leg swelling.  Endocrine: Negative for polydipsia, polyphagia and polyuria.  Neurological: Positive for weakness and light-headedness.    Last CBC Lab Results  Component Value Date   WBC 8.5 03/06/2019   HGB 16.3 03/06/2019   HCT 50.0 03/06/2019   MCV 91.9 03/06/2019   MCH 30.0 03/06/2019   RDW 12.3 03/06/2019   PLT 230 99/37/1696   Last metabolic panel Lab Results  Component Value Date   GLUCOSE 92 03/06/2019   NA 142 03/06/2019   K 3.9 03/06/2019   CL 107 03/06/2019   CO2 27 03/06/2019   BUN 16 03/06/2019   CREATININE 1.03 03/06/2019   GFRNONAA >60 03/06/2019   GFRAA >60 03/06/2019   CALCIUM 9.0 03/06/2019   PROT 8.3 (H) 03/06/2019   ALBUMIN 4.2 03/06/2019  LABGLOB 2.9 09/03/2018   AGRATIO 1.5 09/03/2018   BILITOT 0.7 03/06/2019   ALKPHOS 71 03/06/2019   AST 20 03/06/2019   ALT 24 03/06/2019   ANIONGAP 8 03/06/2019   Last lipids Lab Results  Component Value Date   CHOL 215 (H) 09/03/2018   HDL 30 (L) 09/03/2018   LDLCALC 136 (H) 09/03/2018   TRIG 244 (H) 09/03/2018   CHOLHDL 7.2 (H) 09/03/2018      Objective    BP 138/84 (BP Location: Left Arm, Patient Position: Sitting, Cuff Size: Large)   Pulse 83   Temp (!) 97.3 F (36.3 C) (Temporal)   Resp 16    Wt (!) 365 lb (165.6 kg)   BMI 48.16 kg/m  BP Readings from Last 3 Encounters:  12/21/19 138/84  10/15/19 130/89  04/27/19 134/85   Wt Readings from Last 3 Encounters:  12/21/19 (!) 365 lb (165.6 kg)  10/15/19 (!) 364 lb 4.8 oz (165.2 kg)  04/27/19 (!) 339 lb (153.8 kg)      Physical Exam Vitals reviewed.  Constitutional:      General: He is not in acute distress.    Appearance: Normal appearance. He is well-developed. He is obese. He is not ill-appearing or diaphoretic.  HENT:     Head: Normocephalic and atraumatic.  Neck:     Thyroid: No thyromegaly.     Vascular: No JVD.     Trachea: No tracheal deviation.  Cardiovascular:     Rate and Rhythm: Normal rate and regular rhythm.     Heart sounds: Normal heart sounds. No murmur heard.  No friction rub. No gallop.   Pulmonary:     Effort: Pulmonary effort is normal. No respiratory distress.     Breath sounds: Normal breath sounds. No wheezing or rales.  Musculoskeletal:     Cervical back: Normal range of motion and neck supple.  Lymphadenopathy:     Cervical: No cervical adenopathy.  Neurological:     Mental Status: He is alert.       Results for orders placed or performed in visit on 12/21/19  POCT glycosylated hemoglobin (Hb A1C)  Result Value Ref Range   Hemoglobin A1C 6.8 (A) 4.0 - 5.6 %   Est. average glucose Bld gHb Est-mCnc 148     Assessment & Plan     1. Class 3 severe obesity due to excess calories with serious comorbidity and body mass index (BMI) of 45.0 to 49.9 in adult [facility]) Counseled patient on healthy lifestyle modifications including dieting and exercise.   2. Elevated hemoglobin A1c A1c is elevated into diabetic range. Patient declines starting medication at this time and wants to start back on healthy, low carb dieting. Will give 3 months of lifestyle modifications. F/U in 3 months with A1c. If still elevated he knows we will start diabetic medication and go into more detail about diabetes. He  agrees.  - POCT glycosylated hemoglobin (Hb A1C)  3. Back spasm Noted from long hours standing at work and harder bed. Will give flexeril for prn use. May also apply moist heat, not directly to skin.  - cyclobenzaprine (FLEXERIL) 5 MG tablet; Take 1 tablet (5 mg total) by mouth 3 (three) times daily as needed for muscle spasms.  Dispense: 30 tablet; Refill: 1   Return in about 3 months (around 03/22/2020) for A1c.      Reynolds Bowl, PA-C, have reviewed all documentation for this visit. The documentation on 12/22/19 for the exam, diagnosis, procedures, and  orders are all accurate and complete.   Rubye Beach  [facility] [phone] (phone) [phone] (fax)  [place]

## 2019-12-21 NOTE — Patient Instructions (Signed)
Carbohydrate Counting for Diabetes Mellitus, Adult  Carbohydrate counting is a method of keeping track of how many carbohydrates you eat. Eating carbohydrates naturally increases the amount of sugar (glucose) in the blood. Counting how many carbohydrates you eat helps keep your blood glucose within normal limits, which helps you manage your diabetes (diabetes mellitus). It is important to know how many carbohydrates you can safely have in each meal. This is different for every person. A diet and nutrition specialist (registered dietitian) can help you make a meal plan and calculate how many carbohydrates you should have at each meal and snack. Carbohydrates are found in the following foods:  Grains, such as breads and cereals.  Dried beans and soy products.  Starchy vegetables, such as potatoes, peas, and corn.  Fruit and fruit juices.  Milk and yogurt.  Sweets and snack foods, such as cake, cookies, candy, chips, and soft drinks. How do I count carbohydrates? There are two ways to count carbohydrates in food. You can use either of the methods or a combination of both. Reading "Nutrition Facts" on packaged food The "Nutrition Facts" list is included on the labels of almost all packaged foods and beverages in the [place] It includes:  The serving size.  Information about nutrients in each serving, including the grams (g) of carbohydrate per serving. To use the "Nutrition Facts":  Decide how many servings you will have.  Multiply the number of servings by the number of carbohydrates per serving.  The resulting number is the total amount of carbohydrates that you will be having. Learning standard serving sizes of other foods When you eat carbohydrate foods that are not packaged or do not include "Nutrition Facts" on the label, you need to measure the servings in order to count the amount of carbohydrates:  Measure the foods that you will eat with a food scale or measuring cup, if  needed.  Decide how many standard-size servings you will eat.  Multiply the number of servings by 15. Most carbohydrate-rich foods have about 15 g of carbohydrates per serving. ? For example, if you eat 8 oz (170 g) of strawberries, you will have eaten 2 servings and 30 g of carbohydrates (2 servings x 15 g = 30 g).  For foods that have more than one food mixed, such as soups and casseroles, you must count the carbohydrates in each food that is included. The following list contains standard serving sizes of common carbohydrate-rich foods. Each of these servings has about 15 g of carbohydrates:   hamburger bun or  English muffin.   oz (15 mL) syrup.   oz (14 g) jelly.  1 slice of bread.  1 six-inch tortilla.  3 oz (85 g) cooked rice or pasta.  4 oz (113 g) cooked dried beans.  4 oz (113 g) starchy vegetable, such as peas, corn, or potatoes.  4 oz (113 g) hot cereal.  4 oz (113 g) mashed potatoes or  of a large baked potato.  4 oz (113 g) canned or frozen fruit.  4 oz (120 mL) fruit juice.  4-6 crackers.  6 chicken nuggets.  6 oz (170 g) unsweetened dry cereal.  6 oz (170 g) plain fat-free yogurt or yogurt sweetened with artificial sweeteners.  8 oz (240 mL) milk.  8 oz (170 g) fresh fruit or one small piece of fruit.  24 oz (680 g) popped popcorn. Example of carbohydrate counting Sample meal  3 oz (85 g) chicken breast.  6 oz (170 g)   brown rice.  4 oz (113 g) corn.  8 oz (240 mL) milk.  8 oz (170 g) strawberries with sugar-free whipped topping. Carbohydrate calculation 1. Identify the foods that contain carbohydrates: ? Rice. ? Corn. ? Milk. ? Strawberries. 2. Calculate how many servings you have of each food: ? 2 servings rice. ? 1 serving corn. ? 1 serving milk. ? 1 serving strawberries. 3. Multiply each number of servings by 15 g: ? 2 servings rice x 15 g = 30 g. ? 1 serving corn x 15 g = 15 g. ? 1 serving milk x 15 g = 15 g. ? 1  serving strawberries x 15 g = 15 g. 4. Add together all of the amounts to find the total grams of carbohydrates eaten: ? 30 g + 15 g + 15 g + 15 g = 75 g of carbohydrates total. Summary  Carbohydrate counting is a method of keeping track of how many carbohydrates you eat.  Eating carbohydrates naturally increases the amount of sugar (glucose) in the blood.  Counting how many carbohydrates you eat helps keep your blood glucose within normal limits, which helps you manage your diabetes.  A diet and nutrition specialist (registered dietitian) can help you make a meal plan and calculate how many carbohydrates you should have at each meal and snack. This information is not intended to replace advice given to you by your health care provider. Make sure you discuss any questions you have with your health care provider. Document Revised: 01/03/2017 Document Reviewed: 11/23/2015 Elsevier Patient Education  2020 [employer]  

## 2020-01-11 ENCOUNTER — Ambulatory Visit (INDEPENDENT_AMBULATORY_CARE_PROVIDER_SITE_OTHER): Payer: Commercial Managed Care - PPO | Admitting: Physician Assistant

## 2020-01-11 ENCOUNTER — Other Ambulatory Visit: Payer: Self-pay

## 2020-01-11 ENCOUNTER — Encounter: Payer: Self-pay | Admitting: Physician Assistant

## 2020-01-11 VITALS — BP 133/82 | HR 77 | Temp 97.0°F | Resp 16 | Wt 361.5 lb

## 2020-01-11 DIAGNOSIS — F5101 Primary insomnia: Secondary | ICD-10-CM

## 2020-01-11 DIAGNOSIS — L0292 Furuncle, unspecified: Secondary | ICD-10-CM | POA: Diagnosis not present

## 2020-01-11 MED ORDER — TRAZODONE HCL 100 MG PO TABS: 100.0000 mg | ORAL_TABLET | Freq: Every day | ORAL | 0 refills | Status: DC

## 2020-01-11 MED ORDER — HIBICLENS 4 % EX LIQD: Freq: Every day | CUTANEOUS | 0 refills | Status: DC | PRN

## 2020-01-11 NOTE — Patient Instructions (Signed)
Trazodone tablets What is this medicine? TRAZODONE (TRAZ oh done) is used to treat depression. This medicine may be used for other purposes; ask your health care provider or pharmacist if you have questions. COMMON BRAND NAME(S): Desyrel What should I tell my health care provider before I take this medicine? They need to know if you have any of these conditions:  attempted suicide or thinking about it  bipolar disorder  bleeding problems  glaucoma  heart disease, or previous heart attack  irregular heart beat  kidney or liver disease  low levels of sodium in the blood  an unusual or allergic reaction to trazodone, other medicines, foods, dyes or preservatives  pregnant or trying to get pregnant  breast-feeding How should I use this medicine? Take this medicine by mouth with a glass of water. Follow the directions on the prescription label. Take this medicine shortly after a meal or a light snack. Take your medicine at regular intervals. Do not take your medicine more often than directed. Do not stop taking this medicine suddenly except upon the advice of your doctor. Stopping this medicine too quickly may cause serious side effects or your condition may worsen. A special MedGuide will be given to you by the pharmacist with each prescription and refill. Be sure to read this information carefully each time. Talk to your pediatrician regarding the use of this medicine in children. Special care may be needed. Overdosage: If you think you have taken too much of this medicine contact a poison control center or emergency room at once. NOTE: This medicine is only for you. Do not share this medicine with others. What if I miss a dose? If you miss a dose, take it as soon as you can. If it is almost time for your next dose, take only that dose. Do not take double or extra doses. What may interact with this medicine? Do not take this medicine with any of the following  medications:  certain medicines for fungal infections like fluconazole, itraconazole, ketoconazole, posaconazole, voriconazole  cisapride  dronedarone  linezolid  MAOIs like Carbex, Eldepryl, Marplan, Nardil, and Parnate  mesoridazine  methylene blue (injected into a vein)  pimozide  saquinavir  thioridazine This medicine may also interact with the following medications:  alcohol  antiviral medicines for HIV or AIDS  aspirin and aspirin-like medicines  barbiturates like phenobarbital  certain medicines for blood pressure, heart disease, irregular heart beat  certain medicines for depression, anxiety, or psychotic disturbances  certain medicines for migraine headache like almotriptan, eletriptan, frovatriptan, naratriptan, rizatriptan, sumatriptan, zolmitriptan  certain medicines for seizures like carbamazepine and phenytoin  certain medicines for sleep  certain medicines that treat or prevent blood clots like dalteparin, enoxaparin, warfarin  digoxin  fentanyl  lithium  NSAIDS, medicines for pain and inflammation, like ibuprofen or naproxen  other medicines that prolong the QT interval (cause an abnormal heart rhythm) like dofetilide  rasagiline  supplements like St. John's wort, kava kava, valerian  tramadol  tryptophan This list may not describe all possible interactions. Give your health care provider a list of all the medicines, herbs, non-prescription drugs, or dietary supplements you use. Also tell them if you smoke, drink alcohol, or use illegal drugs. Some items may interact with your medicine. What should I watch for while using this medicine? Tell your doctor if your symptoms do not get better or if they get worse. Visit your doctor or health care professional for regular checks on your progress. Because it may take   several weeks to see the full effects of this medicine, it is important to continue your treatment as prescribed by your  doctor. Patients and their families should watch out for new or worsening thoughts of suicide or depression. Also watch out for sudden changes in feelings such as feeling anxious, agitated, panicky, irritable, hostile, aggressive, impulsive, severely restless, overly excited and hyperactive, or not being able to sleep. If this happens, especially at the beginning of treatment or after a change in dose, call your health care professional. You may get drowsy or dizzy. Do not drive, use machinery, or do anything that needs mental alertness until you know how this medicine affects you. Do not stand or sit up quickly, especially if you are an older patient. This reduces the risk of dizzy or fainting spells. Alcohol may interfere with the effect of this medicine. Avoid alcoholic drinks. This medicine may cause dry eyes and blurred vision. If you wear contact lenses you may feel some discomfort. Lubricating drops may help. See your eye doctor if the problem does not go away or is severe. Your mouth may get dry. Chewing sugarless gum, sucking hard candy and drinking plenty of water may help. Contact your doctor if the problem does not go away or is severe. What side effects may I notice from receiving this medicine? Side effects that you should report to your doctor or health care professional as soon as possible:  allergic reactions like skin rash, itching or hives, swelling of the face, lips, or tongue  elevated mood, decreased need for sleep, racing thoughts, impulsive behavior  confusion  fast, irregular heartbeat  feeling faint or lightheaded, falls  feeling agitated, angry, or irritable  loss of balance or coordination  painful or prolonged erections  restlessness, pacing, inability to keep still  suicidal thoughts or other mood changes  tremors  trouble sleeping  seizures  unusual bleeding or bruising Side effects that usually do not require medical attention (report to your doctor  or health care professional if they continue or are bothersome):  change in sex drive or performance  change in appetite or weight  constipation  headache  muscle aches or pains  nausea This list may not describe all possible side effects. Call your doctor for medical advice about side effects. You may report side effects to FDA at 1-800-FDA-1088. Where should I keep my medicine? Keep out of the reach of children. Store at room temperature between 15 and 30 degrees C (59 to 86 degrees F). Protect from light. Keep container tightly closed. Throw away any unused medicine after the expiration date. NOTE: This sheet is a summary. It may not cover all possible information. If you have questions about this medicine, talk to your doctor, pharmacist, or health care provider.  2020 Elsevier/Gold Standard (2018-06-03 11:46:46)  

## 2020-01-11 NOTE — Progress Notes (Signed)
Established patient visit   Patient: Steven Carrillo   DOB: [birth year]   34 y.o. Male  MRN: [id number] Visit Date: 01/11/2020  Today's healthcare provider: Mar Daring, PA-C   Chief Complaint  Patient presents with  . Recurrent Skin Infections   Subjective    HPI  Patient here with c/o boil/pimple like sores under his belly. Reports that they are red, tender, maybe some swelling. He also has some boils in his inner thigh. We has use peroxide, soap and water to keep area clean. He has been popping some of them.  Patient Active Problem List   Diagnosis Date Noted  . GAD (generalized anxiety disorder) 01/09/2018  . Eosinophilic esophagitis due to food 01/09/2018  . Non-seasonal allergic rhinitis due to pollen 01/09/2018  . Allergic conjunctivitis of both eyes 01/09/2018  . Asthma, intermittent 05/07/2017  . Class 3 severe obesity due to excess calories with serious comorbidity and body mass index (BMI) of 40.0 to 44.9 in adult [facility]) 05/07/2017   Past Medical History:  Diagnosis Date  . ADHD (attention deficit hyperactivity disorder)   . Allergies   . Asthma   . Sleep apnea        Medications: Outpatient Medications Prior to Visit  Medication Sig  . albuterol (PROVENTIL HFA) 108 (90 Base) MCG/ACT inhaler Inhale 1-2 puffs into the lungs every 6 (six) hours as needed for wheezing or shortness of breath.  Marland Kitchen azelastine (OPTIVAR) 0.05 % ophthalmic solution Place 1 drop into both eyes 2 (two) times daily.  . Cetirizine HCl (ZYRTEC ALLERGY) 10 MG CAPS Take by mouth.  . citalopram (CELEXA) 40 MG tablet Take 1 tablet (40 mg total) by mouth daily.  . cyclobenzaprine (FLEXERIL) 5 MG tablet Take 1 tablet (5 mg total) by mouth 3 (three) times daily as needed for muscle spasms.  Marland Kitchen EPINEPHrine 0.3 mg/0.3 mL IJ SOAJ injection USE AS DIRECTED AS NEEDED  . hydrOXYzine (ATARAX/VISTARIL) 25 MG tablet Take 0.5-1 tablets (12.5-25 mg total) by mouth daily as needed for anxiety.  .  NEOMYCIN-POLYMYXIN-HYDROCORTISONE (CORTISPORIN) 1 % SOLN OTIC solution Place 3 drops into the right ear 4 (four) times daily. X 5 days  . omeprazole (PRILOSEC) 40 MG capsule TAKE 1 CAPSULE DAILY   No facility-administered medications prior to visit.    Review of Systems  Constitutional: Negative.   Respiratory: Negative.   Cardiovascular: Negative.   Musculoskeletal: Negative.   Skin: Positive for wound.  Neurological: Negative.     Last CBC Lab Results  Component Value Date   WBC 8.5 03/06/2019   HGB 16.3 03/06/2019   HCT 50.0 03/06/2019   MCV 91.9 03/06/2019   MCH 30.0 03/06/2019   RDW 12.3 03/06/2019   PLT 230 54/62/7035   Last metabolic panel Lab Results  Component Value Date   GLUCOSE 92 03/06/2019   NA 142 03/06/2019   K 3.9 03/06/2019   CL 107 03/06/2019   CO2 27 03/06/2019   BUN 16 03/06/2019   CREATININE 1.03 03/06/2019   GFRNONAA >60 03/06/2019   GFRAA >60 03/06/2019   CALCIUM 9.0 03/06/2019   PROT 8.3 (H) 03/06/2019   ALBUMIN 4.2 03/06/2019   LABGLOB 2.9 09/03/2018   AGRATIO 1.5 09/03/2018   BILITOT 0.7 03/06/2019   ALKPHOS 71 03/06/2019   AST 20 03/06/2019   ALT 24 03/06/2019   ANIONGAP 8 03/06/2019      Objective    BP 133/82 (BP Location: Left Arm, Patient Position: Sitting, Cuff Size: Large)  Pulse 77   Temp (!) 97 F (36.1 C) (Temporal)   Resp 16   Wt (!) 361 lb 8 oz (164 kg)   BMI 47.69 kg/m  BP Readings from Last 3 Encounters:  01/11/20 133/82  12/21/19 138/84  10/15/19 130/89   Wt Readings from Last 3 Encounters:  01/11/20 (!) 361 lb 8 oz (164 kg)  12/21/19 (!) 365 lb (165.6 kg)  10/15/19 (!) 364 lb 4.8 oz (165.2 kg)      Physical Exam Vitals reviewed.  Constitutional:      General: He is not in acute distress.    Appearance: Normal appearance. He is well-developed. He is obese. He is not ill-appearing or diaphoretic.  HENT:     Head: Normocephalic and atraumatic.  Neck:     Trachea: No tracheal deviation.    Cardiovascular:     Rate and Rhythm: Normal rate and regular rhythm.     Heart sounds: Normal heart sounds. No murmur heard.  No friction rub. No gallop.   Pulmonary:     Effort: Pulmonary effort is normal. No respiratory distress.     Breath sounds: Normal breath sounds. No wheezing or rales.  Musculoskeletal:     Cervical back: Normal range of motion and neck supple.  Skin:    General: Skin is warm and dry.     Comments: In skin fold under panus there is scar tissue from previous boils; none currently active or inflamed  Neurological:     Mental Status: He is alert.       No results found for any visits on 01/11/20.  Assessment & Plan     1. Boil None active. Advised to cleanse area with hibiclens. Keep area clean and dry. Can use antiperspirant over the area to help keep dry. Can use drysol pads.  - chlorhexidine (HIBICLENS) 4 % external liquid; Apply topically daily as needed.  Dispense: 475 mL; Refill: 0  2. Primary insomnia Worsening. Has OSA and uses CPAP but still only sleeps 3-4 hours at max. Will give trazodone as below to see if this will help him sleep. F/U in 4 weeks.  - traZODone (DESYREL) 100 MG tablet; Take 1 tablet (100 mg total) by mouth at bedtime.  Dispense: 30 tablet; Refill: 0   No follow-ups on file.      Reynolds Bowl, PA-C, have reviewed all documentation for this visit. The documentation on 01/13/20 for the exam, diagnosis, procedures, and orders are all accurate and complete.   Rubye Beach  [facility] [phone] (phone) [phone] (fax)  [place]

## 2020-03-17 ENCOUNTER — Telehealth (INDEPENDENT_AMBULATORY_CARE_PROVIDER_SITE_OTHER): Payer: Commercial Managed Care - PPO | Admitting: Physician Assistant

## 2020-03-17 ENCOUNTER — Other Ambulatory Visit: Payer: Commercial Managed Care - PPO

## 2020-03-17 DIAGNOSIS — R112 Nausea with vomiting, unspecified: Secondary | ICD-10-CM | POA: Diagnosis not present

## 2020-03-17 DIAGNOSIS — M791 Myalgia, unspecified site: Secondary | ICD-10-CM

## 2020-03-17 DIAGNOSIS — Z20822 Contact with and (suspected) exposure to covid-19: Secondary | ICD-10-CM

## 2020-03-17 MED ORDER — PROMETHAZINE HCL 12.5 MG PO TABS: 12.5000 mg | ORAL_TABLET | Freq: Two times a day (BID) | ORAL | 0 refills | Status: DC | PRN

## 2020-03-17 MED ORDER — PROMETHAZINE-DM 6.25-15 MG/5ML PO SYRP: 5.0000 mL | ORAL_SOLUTION | Freq: Every evening | ORAL | 0 refills | Status: DC | PRN

## 2020-03-17 NOTE — Progress Notes (Signed)
MyChart Video Visit    Virtual Visit via Video Note   This visit type was conducted due to national recommendations for restrictions regarding the COVID-19 Pandemic (e.g. social distancing) in an effort to limit this patient's exposure and mitigate transmission in our community. This patient is at least at moderate risk for complications without adequate follow up. This format is felt to be most appropriate for this patient at this time. Physical exam was limited by quality of the video and audio technology used for the visit.   Patient location: Home Provider location: Office   I discussed the limitations of evaluation and management by telemedicine and the availability of in person appointments. The patient expressed understanding and agreed to proceed.  Patient: Steven Carrillo   DOB: 1985-07-01   34 y.o. Male  MRN: [id number] Visit Date: 03/17/2020  Today's healthcare provider: Trinna Post, PA-C   Chief Complaint  Patient presents with  . Cough   Subjective    Cough This is a new problem. The current episode started in the past 7 days. The problem has been gradually worsening. The problem occurs constantly. The cough is non-productive. Associated symptoms include chest pain, chills, a fever, headaches, nasal congestion, shortness of breath, sweats and wheezing. Pertinent negatives include no ear congestion, ear pain, heartburn, hemoptysis, myalgias, postnasal drip, rhinorrhea or sore throat. Nothing aggravates the symptoms. Treatments tried: thera-flu. The treatment provided mild relief. His past medical history is significant for asthma and environmental allergies.    Patient reports he has been sick for one week. Reports nausea and vomiting. Reports increasing body aches. Reports feeling very fatigued. Reports headache. He has not been vaccinated for COVID. He reports he has a 45 week old premature child and hasn't left the house much.     Medications: Outpatient  Medications Prior to Visit  Medication Sig  . albuterol (PROVENTIL HFA) 108 (90 Base) MCG/ACT inhaler Inhale 1-2 puffs into the lungs every 6 (six) hours as needed for wheezing or shortness of breath.  Marland Kitchen azelastine (OPTIVAR) 0.05 % ophthalmic solution Place 1 drop into both eyes 2 (two) times daily.  . benzonatate (TESSALON) 100 MG capsule Take 100 mg by mouth 3 (three) times daily as needed.  . Cetirizine HCl (ZYRTEC ALLERGY) 10 MG CAPS Take by mouth.  . chlorhexidine (HIBICLENS) 4 % external liquid Apply topically daily as needed.  . citalopram (CELEXA) 40 MG tablet Take 1 tablet (40 mg total) by mouth daily.  . cyclobenzaprine (FLEXERIL) 5 MG tablet Take 1 tablet (5 mg total) by mouth 3 (three) times daily as needed for muscle spasms.  Marland Kitchen EPINEPHrine 0.3 mg/0.3 mL IJ SOAJ injection USE AS DIRECTED AS NEEDED  . hydrOXYzine (ATARAX/VISTARIL) 25 MG tablet Take 0.5-1 tablets (12.5-25 mg total) by mouth daily as needed for anxiety.  . NEOMYCIN-POLYMYXIN-HYDROCORTISONE (CORTISPORIN) 1 % SOLN OTIC solution Place 3 drops into the right ear 4 (four) times daily. X 5 days  . omeprazole (PRILOSEC) 40 MG capsule TAKE 1 CAPSULE DAILY  . traZODone (DESYREL) 100 MG tablet Take 1 tablet (100 mg total) by mouth at bedtime.   No facility-administered medications prior to visit.    Review of Systems  Constitutional: Positive for chills and fever.  HENT: Negative for ear pain, postnasal drip, rhinorrhea and sore throat.   Respiratory: Positive for cough, shortness of breath and wheezing. Negative for hemoptysis.   Cardiovascular: Positive for chest pain.  Gastrointestinal: Negative for heartburn.  Musculoskeletal: Negative for myalgias.  Allergic/Immunologic:  Positive for environmental allergies.  Neurological: Positive for headaches.      Objective    There were no vitals taken for this visit.   Physical Exam Constitutional:      Appearance: Normal appearance.  Pulmonary:     Effort: Pulmonary  effort is normal. No respiratory distress.  Neurological:     Mental Status: He is alert.  Psychiatric:        Mood and Affect: Mood normal.        Behavior: Behavior normal.        Assessment & Plan    1. Myalgia   2. Suspected COVID-19 virus infection  - symptoms and exam c/w viral URI  - no evidence of strep pharyngitis, CAP, AOM, bacterial sinusitis, or other bacterial infection - concern for possible COVID19 infection - will send for outpatient testing - discussed need to quarantine 10 days from start of symptoms and until fever-free for at least 48 hours - discussed need to quarantine household members - discussed symptomatic management, natural course, and return precautions  - promethazine-dextromethorphan (PROMETHAZINE-DM) 6.25-15 MG/5ML syrup; Take 5 mLs by mouth at bedtime as needed.  Dispense: 118 mL; Refill: 0 - Novel Coronavirus, NAA ([facility]) - promethazine (PHENERGAN) 12.5 MG tablet; Take 1 tablet (12.5 mg total) by mouth 2 (two) times daily as needed for nausea or vomiting.  Dispense: 20 tablet; Refill: 0  3. Nausea and vomiting, intractability of vomiting not specified, unspecified vomiting type     No follow-ups on file.     I discussed the assessment and treatment plan with the patient. The patient was provided an opportunity to ask questions and all were answered. The patient agreed with the plan and demonstrated an understanding of the instructions.   The patient was advised to call back or seek an in-person evaluation if the symptoms worsen or if the condition fails to improve as anticipated.   ITrinna Post, PA-C, have reviewed all documentation for this visit. The documentation on 03/17/20 for the exam, diagnosis, procedures, and orders are all accurate and complete.  The entirety of the information documented in the History of Present Illness, Review of Systems and Physical Exam were personally obtained by me. Portions of this information  were initially documented by [facility] and reviewed by me for thoroughness and accuracy.    Paulene Floor [facility] [phone] (phone) [phone] (fax)  [place]

## 2020-03-19 LAB — NOVEL CORONAVIRUS, NAA: SARS-CoV-2, NAA: DETECTED — AB

## 2020-03-19 LAB — SARS-COV-2, NAA 2 DAY TAT

## 2020-03-20 ENCOUNTER — Emergency Department: Payer: Commercial Managed Care - PPO

## 2020-03-20 ENCOUNTER — Other Ambulatory Visit: Payer: Self-pay

## 2020-03-20 ENCOUNTER — Encounter: Payer: Self-pay | Admitting: Emergency Medicine

## 2020-03-20 ENCOUNTER — Emergency Department
Admission: EM | Admit: 2020-03-20 | Discharge: 2020-03-21 | Disposition: A | Discharge status: Home / Self Care | Payer: Commercial Managed Care - PPO | Attending: Emergency Medicine | Admitting: Emergency Medicine

## 2020-03-20 DIAGNOSIS — U071 COVID-19: Secondary | ICD-10-CM | POA: Diagnosis not present

## 2020-03-20 DIAGNOSIS — Z5321 Procedure and treatment not carried out due to patient leaving prior to being seen by health care provider: Secondary | ICD-10-CM | POA: Diagnosis not present

## 2020-03-20 DIAGNOSIS — M791 Myalgia, unspecified site: Secondary | ICD-10-CM | POA: Diagnosis present

## 2020-03-20 NOTE — ED Notes (Signed)
Pt up to Stat desk asking "how much longer will it be". This RN stated to pt that we are unable to give out times. Pt states that "I am going to pass out". This RN telling pt that he needs to sit back down if he feel like he will pas out. Pt stating that the urgency is due to his wife needing to leave at 0600.

## 2020-03-20 NOTE — ED Notes (Signed)
Pt up to the front desk asking to be revitalized. This tech took the pt's vitals. Pt then stated that he wanted to leave, he is tired, thirsty, and had been here for several hours and needed to sleep. Lea RN notified

## 2020-03-20 NOTE — ED Triage Notes (Signed)
Pt arrives POV to triage with c/o generalized body aches and pains due to Covid.Pt is in NAD.

## 2020-03-21 ENCOUNTER — Other Ambulatory Visit: Payer: Self-pay | Admitting: Internal Medicine

## 2020-03-21 ENCOUNTER — Ambulatory Visit (HOSPITAL_COMMUNITY)
Admission: RE | Admit: 2020-03-21 | Discharge: 2020-03-21 | Disposition: A | Discharge status: Home / Self Care | Payer: Commercial Managed Care - PPO | Source: Ambulatory Visit | Attending: Pulmonary Disease | Admitting: Pulmonary Disease

## 2020-03-21 ENCOUNTER — Telehealth: Payer: Self-pay

## 2020-03-21 DIAGNOSIS — U071 COVID-19: Secondary | ICD-10-CM | POA: Diagnosis present

## 2020-03-21 DIAGNOSIS — J4520 Mild intermittent asthma, uncomplicated: Secondary | ICD-10-CM | POA: Diagnosis present

## 2020-03-21 DIAGNOSIS — Z6841 Body Mass Index (BMI) 40.0 and over, adult: Secondary | ICD-10-CM | POA: Insufficient documentation

## 2020-03-21 MED ORDER — SODIUM CHLORIDE 0.9 % IV SOLN: INTRAVENOUS | Status: DC | PRN

## 2020-03-21 MED ORDER — EPINEPHRINE 0.3 MG/0.3ML IJ SOAJ: 0.3000 mg | Freq: Once | INTRAMUSCULAR | Status: DC | PRN

## 2020-03-21 MED ORDER — SODIUM CHLORIDE 0.9 % IV SOLN
1200.0000 mg | Freq: Once | INTRAVENOUS | Status: AC
  Administered 2020-03-21: 1200 mg via INTRAVENOUS

## 2020-03-21 MED ORDER — ALBUTEROL SULFATE HFA 108 (90 BASE) MCG/ACT IN AERS: 2.0000 | INHALATION_SPRAY | Freq: Once | RESPIRATORY_TRACT | Status: DC | PRN

## 2020-03-21 MED ORDER — DIPHENHYDRAMINE HCL 50 MG/ML IJ SOLN: 50.0000 mg | Freq: Once | INTRAMUSCULAR | Status: DC | PRN

## 2020-03-21 MED ORDER — METHYLPREDNISOLONE SODIUM SUCC 125 MG IJ SOLR: 125.0000 mg | Freq: Once | INTRAMUSCULAR | Status: DC | PRN

## 2020-03-21 MED ORDER — FAMOTIDINE IN NACL 20-0.9 MG/50ML-% IV SOLN: 20.0000 mg | Freq: Once | INTRAVENOUS | Status: DC | PRN

## 2020-03-21 MED ORDER — IBUPROFEN 200 MG PO TABS
600.0000 mg | ORAL_TABLET | Freq: Once | ORAL | Status: AC
  Administered 2020-03-21: 600 mg via ORAL
  Filled 2020-03-21: qty 3

## 2020-03-21 NOTE — Discharge Instructions (Signed)

## 2020-03-21 NOTE — Telephone Encounter (Signed)
I am not sure about an infusion clinic with [facility].  Our infusion center is at [facility]. If he is agreeable I can send information for him to be added on there. He has to be within 10 days from symptom onset.

## 2020-03-21 NOTE — Telephone Encounter (Signed)
Per Jeneen Rinks someone called them and they were able to get in at [facility]

## 2020-03-21 NOTE — Telephone Encounter (Signed)
perfect

## 2020-03-21 NOTE — Telephone Encounter (Signed)
Copied from [place] [id number]. Topic: Quick Communication - See Telephone Encounter >> Mar 21, 2020  8:11 AM Loma Boston wrote: CRM for notification. See Telephone encounter for: 03/21/20.Pt and wife calling in to get numbers for Covid Infusion [facility], wanting a referral, gave # for [facility] and pt wanting a call back from dr already had X-Ray at [facility] [phone]

## 2020-03-21 NOTE — Progress Notes (Signed)
  Diagnosis: COVID-19  Physician: Dr. Wright  Procedure: Covid Infusion Clinic Med: casirivimab\imdevimab infusion - Provided patient with casirivimab\imdevimab fact sheet for patients, parents and caregivers prior to infusion.  Complications: No immediate complications noted.  Discharge: Discharged home   Onika Gudiel R [place] Kotarski 03/21/2020   

## 2020-03-21 NOTE — Progress Notes (Signed)
I connected by phone with Steven Carrillo on 03/21/2020 at 11:31 AM to discuss the potential use of a new treatment for mild to moderate COVID-19 viral infection in non-hospitalized patients.  This patient is a 34 y.o. male that meets the FDA criteria for Emergency Use Authorization of COVID monoclonal antibody casirivimab/imdevimab or bamlanivimab/eteseviamb.  Has a (+) direct SARS-CoV-2 viral test result  Has mild or moderate COVID-19   Is NOT hospitalized due to COVID-19  Is within 10 days of symptom onset  Has at least one of the high risk factor(s) for progression to severe COVID-19 and/or hospitalization as defined in EUA.  Specific high risk criteria : BMI > 25   I have spoken and communicated the following to the patient or parent/caregiver regarding COVID monoclonal antibody treatment:  1. FDA has authorized the emergency use for the treatment of mild to moderate COVID-19 in adults and pediatric patients with positive results of direct SARS-CoV-2 viral testing who are 1 years of age and older weighing at least 40 kg, and who are at high risk for progressing to severe COVID-19 and/or hospitalization.  2. The significant known and potential risks and benefits of COVID monoclonal antibody, and the extent to which such potential risks and benefits are unknown.  3. Information on available alternative treatments and the risks and benefits of those alternatives, including clinical trials.  4. Patients treated with COVID monoclonal antibody should continue to self-isolate and use infection control measures (e.g., wear mask, isolate, social distance, avoid sharing personal items, clean and disinfect high touch surfaces, and frequent handwashing) according to CDC guidelines.   5. The patient or parent/caregiver has the option to accept or refuse COVID monoclonal antibody treatment.  After reviewing this information with the patient, the patient has agreed to receive one of the available  covid 19 monoclonal antibodies and will be provided an appropriate fact sheet prior to infusion.   Alan Ripper, [place]

## 2020-03-24 ENCOUNTER — Ambulatory Visit: Payer: Commercial Managed Care - PPO | Admitting: Physician Assistant
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
## 2021-02-28 ENCOUNTER — Ambulatory Visit: Payer: Self-pay

## 2021-02-28 ENCOUNTER — Telehealth: Payer: Commercial Managed Care - PPO | Admitting: Nurse Practitioner

## 2021-02-28 DIAGNOSIS — R110 Nausea: Secondary | ICD-10-CM

## 2021-02-28 DIAGNOSIS — L551 Sunburn of second degree: Secondary | ICD-10-CM

## 2021-02-28 MED ORDER — SILVER SULFADIAZINE 1 % EX CREA: 1.0000 "application " | TOPICAL_CREAM | Freq: Every day | CUTANEOUS | 0 refills | Status: DC

## 2021-02-28 MED ORDER — ONDANSETRON 4 MG PO TBDP: 4.0000 mg | ORAL_TABLET | Freq: Three times a day (TID) | ORAL | 0 refills | Status: DC | PRN

## 2021-02-28 NOTE — Telephone Encounter (Signed)
Reason for Disposition  [1] Looks infected (spreading redness, red streak, pus) AND [2] no fever  Answer Assessment - Initial Assessment Questions 1. APPEARANCE of SKIN: "What does it look like?" "Where is the sunburn located?"      Bright red 2. BLISTERS: "Are there any blisters?" If Yes, ask: "Where are they located?" "How big are they?" "Are they closed or broken?"      Left knee 3. EXTENT: "How much of the body has a sunburn?" "How large is the area of blistering?" (can compare to the size of their palm)     Just knee 4. ONSET: "When did the sun exposure occur?" (Hours or days ago)      Sunday 5. PAIN: "How painful is the sunburn?"  (Scale 1-10; mild, moderate or severe)     Moderate 6. OTHER SYMPTOMS: "Do you have any other symptoms?" (e.g., lightheadedness, nausea)     None 7. PREGNANCY: "Is there any chance you are pregnant?" "When was your last menstrual period?"     N/A  Protocols used: Sunburn-A-AH
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
## 2021-03-01 NOTE — Telephone Encounter (Signed)
Patient had a video visit with Apolonio Schneiders FNP on 02/28/21 for sunburn issue.

## 2021-03-06 ENCOUNTER — Telehealth: Payer: Self-pay
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
## 2021-03-07 NOTE — Telephone Encounter (Signed)
If I have anything Ok for 20 or ok to double book, we can use it. Can double book nurse visits on other schedules. Could offer [place] or [place] also

## 2021-03-07 NOTE — Telephone Encounter (Signed)
Lmtcb to schedule with Dr. Rosanna Randy on 03/09/21.

## 2021-03-10 ENCOUNTER — Ambulatory Visit: Payer: Commercial Managed Care - PPO | Admitting: Family Medicine

## 2021-03-10 ENCOUNTER — Encounter: Payer: Self-pay | Admitting: Family Medicine

## 2021-03-10 ENCOUNTER — Other Ambulatory Visit: Payer: Self-pay

## 2021-03-10 VITALS — BP 138/79 | HR 86 | Temp 98.4°F | Resp 22 | Wt 355.0 lb

## 2021-03-10 DIAGNOSIS — L309 Dermatitis, unspecified: Secondary | ICD-10-CM
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
## 2021-06-07 ENCOUNTER — Ambulatory Visit: Payer: Self-pay

## 2021-06-07 NOTE — Telephone Encounter (Signed)
Patient called in to say that clogged ears, sore throat, fever, and vomiting 2 times in 5 days. On Tamiflu 3rd day. Say that his daughter was diagnosed with the flu and he know that is what he has. Please advise  Ph# [phone]    Chief Complaint: Flu Symptoms: Sore throat, fever,vomiting. Frequency: Started 3 days ago Pertinent Negatives: Patient denies  Disposition: [] ED /[] Urgent Care (no appt availability in office) / [] Appointment(In office/virtual)/ []  [facility]/ [] Home Care/ [] Refused Recommended Disposition  Additional Notes: Pt. Asking if he can come in for a flu test only. Has already had a tele visit through work. Per Jiles Garter in the practice pt. Would need a visit. Instructed pt. To reach out to tele health for a note for work, which he is asking for, Verbalizes understanding.    Answer Assessment - Initial Assessment Questions 1. TYPE of EXPOSURE: "How were you exposed?" (e.g., close contact, not a close contact)     Daughter 2. DATE of EXPOSURE: "When did the exposure occur?" (e.g., hour, days, weeks)     This week 3. PREGNANCY: "Is there any chance you are pregnant?" "When was your last menstrual period?"     N/A 4. HIGH RISK for COMPLICATIONS: "Do you have any heart or lung problems?" "Do you have a weakened immune system?" (e.g., CHF, COPD, asthma, HIV positive, chemotherapy, renal failure, diabetes mellitus, sickle cell anemia)     No 5. SYMPTOMS: "Do you have any symptoms?" (e.g., cough, fever, sore throat, difficulty breathing).     Fever, sore throat, ear pain, vomiting.  Protocols used: Influenza Exposure-A-AH

## 2021-08-28 ENCOUNTER — Ambulatory Visit: Payer: Self-pay

## 2021-08-28 NOTE — Telephone Encounter (Signed)
?  Chief Complaint: Productive cough since January ?Symptoms: Cough ?Frequency: since de ?Pertinent Negatives: Patient denies SOB, fever ?Disposition: '[]'$ ED /'[]'$ Urgent Care (no appt availability in office) / Appointment(In office/virtual)/ '[]'$  [facility]/ '[]'$ Home Care/ '[]'$ Refused Recommended Disposition /'[]'$ [facility]/ '[x]'$  Follow-up with PCP ?Additional Notes: Pt has had cough since January.  Cough is sometimes productive with green sputum. ?CPAP machine is hurting his throat. ?Reason for Disposition ? Cough has been present for > 3 weeks ? ?Answer Assessment - Initial Assessment Questions ?1. ONSET: "When did the cough begin?"  ?    January ?2. SEVERITY: "How bad is the cough today?"  ?    Coughing  - sometimes just one a day ?3. SPUTUM: "Describe the color of your sputum" (none, dry cough; clear, white, yellow, green) ?    White  - more often green ?4. HEMOPTYSIS: "Are you coughing up any blood?" If so ask: "How much?" (flecks, streaks, tablespoons, etc.) ?    no ?5. DIFFICULTY BREATHING: "Are you having difficulty breathing?" If Yes, ask: "How bad is it?" (e.g., mild, moderate, severe)  ?  - MILD: No SOB at rest, mild SOB with walking, speaks normally in sentences, can lie down, no retractions, pulse < 100.  ?  - MODERATE: SOB at rest, SOB with minimal exertion and prefers to sit, cannot lie down flat, speaks in phrases, mild retractions, audible wheezing, pulse 100-120.  ?  - SEVERE: Very SOB at rest, speaks in single words, struggling to breathe, sitting hunched forward, retractions, pulse > 120  ?    When he has a cough fit. Hx of asthma ?6. FEVER: "Do you have a fever?" If Yes, ask: "What is your temperature, how was it measured, and when did it start?" ?    none ?7. CARDIAC HISTORY: "Do you have any history of heart disease?" (e.g., heart attack, congestive heart failure)  ?    no ?8. LUNG HISTORY: "Do you have any history of lung disease?"  (e.g., pulmonary embolus, asthma, emphysema) ?     asthma ?9. PE RISK FACTORS: "Do you have a history of blood clots?" (or: recent major surgery, recent prolonged travel, bedridden) ?    no ?10. OTHER SYMPTOMS: "Do you have any other symptoms?" (e.g., runny nose, wheezing, chest pain) ?      no ?11. PREGNANCY: "Is there any chance you are pregnant?" "When was your last menstrual period?" ?      no ?12. TRAVEL: "Have you traveled out of the country in the last month?" (e.g., travel history, exposures) ?      no ? ?Protocols used: Cough - Acute Productive-A-AH ? ?

## 2021-08-30 ENCOUNTER — Ambulatory Visit: Payer: Commercial Managed Care - PPO | Admitting: Physician Assistant

## 2021-08-30 ENCOUNTER — Encounter: Payer: Self-pay | Admitting: Physician Assistant

## 2021-08-30 ENCOUNTER — Other Ambulatory Visit: Payer: Self-pay

## 2021-08-30 VITALS — BP 119/77 | HR 82 | Temp 98.2°F | Resp 14 | Ht 73.0 in | Wt 339.9 lb

## 2021-08-30 DIAGNOSIS — J45909 Unspecified asthma, uncomplicated: Secondary | ICD-10-CM | POA: Diagnosis not present

## 2021-08-30 DIAGNOSIS — K200 Eosinophilic esophagitis: Secondary | ICD-10-CM

## 2021-08-30 DIAGNOSIS — R059 Cough, unspecified: Secondary | ICD-10-CM

## 2021-08-30 DIAGNOSIS — Z6841 Body Mass Index (BMI) 40.0 and over, adult: Secondary | ICD-10-CM

## 2021-08-30 DIAGNOSIS — Z9989 Dependence on other enabling machines and devices: Secondary | ICD-10-CM

## 2021-08-30 DIAGNOSIS — J301 Allergic rhinitis due to pollen: Secondary | ICD-10-CM

## 2021-08-30 DIAGNOSIS — G4733 Obstructive sleep apnea (adult) (pediatric): Secondary | ICD-10-CM

## 2021-08-30 MED ORDER — IPRATROPIUM BROMIDE 0.03 % NA SOLN: 2.0000 | Freq: Two times a day (BID) | NASAL | 3 refills | Status: AC

## 2021-08-30 MED ORDER — OMEPRAZOLE 40 MG PO CPDR
40.0000 mg | DELAYED_RELEASE_CAPSULE | Freq: Every day | ORAL | 3 refills | Status: DC
Start: 2021-08-30 — End: 2022-02-16

## 2021-08-30 NOTE — Progress Notes (Signed)
?  ? ? ?Established patient visit ? ? ?Patient: Steven Carrillo   DOB: July 16, 1985   36 y.o. Male  MRN: [id number] ?Visit Date: 08/30/2021 ? ?Today's healthcare provider: [name], PA-C  ? ?Chief Complaint  ?Patient presents with  ? Cough  ? ?Subjective  ?  ?Cough ?This is a new problem. The current episode started more than 2 months ago (since middle of Dec). The problem has been unchanged. The problem occurs every few minutes. Every morning, he has cough for a few hours.The cough is productive with bright green sputum. Associated symptoms include nasal congestion , heartburn and wheezing. Pertinent negatives include no chest pain, chills, ear congestion, ear pain, fever, headaches, hemoptysis, myalgias, rash, rhinorrhea, sore throat, shortness of breath, sweats or weight loss. The symptoms are aggravated by pollens and dust. He has tried OTC inhaler for the symptoms. The treatment provided no relief. He started to use an inhaled albuterol a few time per week vs a few time per month before. His past medical history is significant for asthma, eosinophilic esophagitis and environmental allergies. There is no history of bronchiectasis, COPD or pneumonia.  ? ?Medications: ?Outpatient Medications Prior to Visit  ?Medication Sig  ? albuterol (VENTOLIN HFA) 108 (90 Base) MCG/ACT inhaler   ? azelastine (ASTELIN) 0.1 % nasal spray   ? azelastine (OPTIVAR) 0.05 % ophthalmic solution 1 drop into affected eye  ? azelastine (OPTIVAR) 0.05 % ophthalmic solution   ? cetirizine (ZYRTEC) 10 MG tablet   ? Cetirizine HCl 10 MG CAPS Take by mouth.  ? EPINEPHrine 0.3 mg/0.3 mL IJ SOAJ injection USE AS DIRECTED AS NEEDED  ? EQ ALLERGY RELIEF, CETIRIZINE, 10 MG tablet Take 10 mg by mouth daily.  ? lidocaine (XYLOCAINE) 2 % solution 15 mL.  ? montelukast (SINGULAIR) 10 MG tablet   ? omeprazole (PRILOSEC) 40 MG capsule TAKE 1 CAPSULE DAILY  ? citalopram (CELEXA) 40 MG tablet Take 1 tablet (40 mg total) by mouth daily. (Patient not taking:  Reported on 08/30/2021)  ? [DISCONTINUED] cyclobenzaprine (FLEXERIL) 5 MG tablet Take 1 tablet (5 mg total) by mouth 3 (three) times daily as needed for muscle spasms. (Patient not taking: Reported on 03/10/2021)  ? [DISCONTINUED] ondansetron (ZOFRAN ODT) 4 MG disintegrating tablet Take 1 tablet (4 mg total) by mouth every 8 (eight) hours as needed for nausea or vomiting. (Patient not taking: Reported on 03/10/2021)  ? [DISCONTINUED] promethazine (PHENERGAN) 12.5 MG tablet Take 1 tablet (12.5 mg total) by mouth 2 (two) times daily as needed for nausea or vomiting. (Patient not taking: Reported on 03/10/2021)  ? [DISCONTINUED] silver sulfADIAZINE (SILVADENE) 1 % cream Apply 1 application topically daily.  ? ?No facility-administered medications prior to visit.  ? ? ?Review of Systems  ?Constitutional:  Negative for chills and fever.  ?HENT:  Negative for ear pain, rhinorrhea and sore throat.   ?Respiratory:  Positive for cough and wheezing. Negative for shortness of breath.   ?Cardiovascular:  Negative for chest pain.  ?Musculoskeletal:  Negative for myalgias.  ?Skin:  Negative for rash.  ?Allergic/Immunologic: Positive for environmental allergies.  ?Neurological:  Negative for headaches.  ? ?  Objective  ?  ?BP 119/77 (BP Location: Right Arm, Patient Position: Standing, Cuff Size: Large)   Pulse 82   Temp 98.2 ?F (36.8 ?C)   Resp 14   Ht '6\' 1"'$  (1.854 m)   Wt (!) 339 lb 14.4 oz (154.2 kg)   SpO2 98%   BMI 44.84 kg/m?  ? ? ?  Physical Exam ?Vitals and nursing note reviewed.  ?Constitutional:   ?   Appearance: Normal appearance. He is obese.  ?HENT:  ?   Head: Normocephalic and atraumatic.  ?   Right Ear: No middle ear effusion. There is impacted cerumen.  ?   Left Ear: Ear canal and external ear normal. A middle ear effusion (mild) is present. There is no impacted cerumen.  ?   Nose: Congestion and rhinorrhea present.  ?   Mouth/Throat:  ?   Mouth: Mucous membranes are moist.  ?   Pharynx: No oropharyngeal exudate or  posterior oropharyngeal erythema.  ?Eyes:  ?   General:     ?   Right eye: No discharge.     ?   Left eye: No discharge.  ?   Extraocular Movements: Extraocular movements intact.  ?   Pupils: Pupils are equal, round, and reactive to light.  ?Cardiovascular:  ?   Rate and Rhythm: Normal rate and regular rhythm.  ?   Pulses: Normal pulses.  ?Pulmonary:  ?   Effort: Pulmonary effort is normal.  ?   Breath sounds: Normal breath sounds.  ?Abdominal:  ?   General: Abdomen is flat. Bowel sounds are normal. There is no distension.  ?   Palpations: Abdomen is soft. There is no mass.  ?   Tenderness: There is no abdominal tenderness. There is no guarding or rebound.  ?   Hernia: No hernia is present.  ?Neurological:  ?   Mental Status: He is alert.  ?  ? ? ? Assessment & Plan  ?  ? ?1. Cough in adult patient ?>8 weeks ?Possible postinfectious etiology./ UACS - use antihistamines and intranasal GC ?Hx of asthma - continue albuterol as needed, OSA - use CPAP.  ?Could not be a medication side effects as he does not take ACEI. Does not smoke and no hx of smoking. ?- ipratropium (ATROVENT) 0.03 % nasal spray; Place 2 sprays into both nostrils every 12 (twelve) hours.  Dispense: 30 mL; Refill: 3  ?Recommended saline nasal rinses, humidifier(steamed vapor) ?- Might consider CXR at next visit ? ?Might be due to GERD ?Heartburn, hx of  eosinophilic esophagitis ?- omeprazole (PRILOSEC) 40 MG capsule; Take 1 capsule (40 mg total) by mouth daily.  Dispense: 30 capsule; Refill: 3 ?Raise the head of bed, avoid trigger food, avoid immediate reclining after a meal ?Start food diary ? ?Turmeric to break the cycle of cough ?   ?The patient was advised to call back or seek an in-person evaluation if the symptoms worsen or if the condition fails to improve as anticipated. ? ?I discussed the assessment and treatment plan with the patient. The patient was provided an opportunity to ask questions and all were answered. The patient agreed with the  plan and demonstrated an understanding of the instructions. ? ?The entirety of the information documented in the History of Present Illness, Review of Systems and Physical Exam were personally obtained by me. Portions of this information were initially documented by the CMA and reviewed by me for thoroughness and accuracy.   ? ?[name], PA-C  ?[place] ?[phone] (phone) ?[phone] (fax) ? ?[facility]  ?

## 2021-09-10 NOTE — Progress Notes (Deleted)
? ?  ? ? ?  Established patient visit ? ? ?Patient: Steven Carrillo   DOB: Aug 21, 1985   36 y.o. Male  MRN: [id number] ?Visit Date: 09/13/2021 ? ?Today's healthcare provider: [name], PA-C  ? ?No chief complaint on file. ? ?Subjective  ?  ?HPI  ?*** ?  ?1. Cough in adult patient ?>8 weeks ?Possible postinfectious etiology./ UACS - use antihistamines and intranasal GC ?Hx of asthma - continue albuterol as needed, OSA - use CPAP.  ?Could not be a medication side effects as he does not take ACEI. Does not smoke and no hx of smoking. ?- ipratropium (ATROVENT) 0.03 % nasal spray; Place 2 sprays into both nostrils every 12 (twelve) hours.  Dispense: 30 mL; Refill: 3  ?Recommended saline nasal rinses, humidifier(steamed vapor) ?- Might consider CXR at next visit ?  ?Might be due to GERD ?Heartburn, hx of  eosinophilic esophagitis ?- omeprazole (PRILOSEC) 40 MG capsule; Take 1 capsule (40 mg total) by mouth daily.  Dispense: 30 capsule; Refill: 3 ?Raise the head of bed, avoid trigger food, avoid immediate reclining after a meal ?Start food diary ?  ?Turmeric to break the cycle of cough ?  ?Medications: ?Outpatient Medications Prior to Visit  ?Medication Sig  ? albuterol (VENTOLIN HFA) 108 (90 Base) MCG/ACT inhaler   ? azelastine (ASTELIN) 0.1 % nasal spray   ? azelastine (OPTIVAR) 0.05 % ophthalmic solution 1 drop into affected eye  ? azelastine (OPTIVAR) 0.05 % ophthalmic solution   ? cetirizine (ZYRTEC) 10 MG tablet   ? Cetirizine HCl 10 MG CAPS Take by mouth.  ? citalopram (CELEXA) 40 MG tablet Take 1 tablet (40 mg total) by mouth daily. (Patient not taking: Reported on 08/30/2021)  ? EPINEPHrine 0.3 mg/0.3 mL IJ SOAJ injection USE AS DIRECTED AS NEEDED  ? EQ ALLERGY RELIEF, CETIRIZINE, 10 MG tablet Take 10 mg by mouth daily.  ? ipratropium (ATROVENT) 0.03 % nasal spray Place 2 sprays into both nostrils every 12 (twelve) hours.  ? lidocaine (XYLOCAINE) 2 % solution 15 mL.  ? montelukast (SINGULAIR) 10 MG tablet   ?  omeprazole (PRILOSEC) 40 MG capsule Take 1 capsule (40 mg total) by mouth daily.  ? ?No facility-administered medications prior to visit.  ? ? ?Review of Systems ? ?{Labs  Heme  Chem  Endocrine  Serology  Results Review (optional):23779} ?  Objective  ?  ?There were no vitals taken for this visit. ?{Show previous vital signs (optional):23777} ? ?Physical Exam  ?*** ? ?No results found for any visits on 09/13/21. ? Assessment & Plan  ?  ? ?*** ? ?No follow-ups on file.  ?   ? ?The patient was advised to call back or seek an in-person evaluation if the symptoms worsen or if the condition fails to improve as anticipated. ? ?I discussed the assessment and treatment plan with the patient. The patient was provided an opportunity to ask questions and all were answered. The patient agreed with the plan and demonstrated an understanding of the instructions. ? ?The entirety of the information documented in the History of Present Illness, Review of Systems and Physical Exam were personally obtained by me. Portions of this information were initially documented by the CMA and reviewed by me for thoroughness and accuracy.   ? ? ?[name], PA-C  ?[place] ?[phone] (phone) ?[phone] (fax) ? ?[facility]  ?

## 2021-09-13 ENCOUNTER — Ambulatory Visit: Payer: Commercial Managed Care - PPO | Admitting: Physician Assistant

## 2021-09-17 NOTE — Progress Notes (Deleted)
? ?Established Patient Office Visit ? ?Subjective:  ?Patient ID: Steven Carrillo, male    DOB: Nov 17, 1985  Age: 36 y.o. MRN: [id number] ? ?CC: No chief complaint on file. ? ? ?HPI ?[name] presents for *** ? ? ? ?  ?1. Cough in adult patient ?>8 weeks ?Possible postinfectious etiology./ UACS - use antihistamines and intranasal GC ?Hx of asthma - continue albuterol as needed, OSA - use CPAP.  ?Could not be a medication side effects as he does not take ACEI. Does not smoke and no hx of smoking. ?- ipratropium (ATROVENT) 0.03 % nasal spray; Place 2 sprays into both nostrils every 12 (twelve) hours.  Dispense: 30 mL; Refill: 3  ?Recommended saline nasal rinses, humidifier(steamed vapor) ?- Might consider CXR at next visit ?  ?Might be due to GERD ?Heartburn, hx of  eosinophilic esophagitis ?- omeprazole (PRILOSEC) 40 MG capsule; Take 1 capsule (40 mg total) by mouth daily.  Dispense: 30 capsule; Refill: 3 ?Raise the head of bed, avoid trigger food, avoid immediate reclining after a meal ?Start food diary ?  ?Turmeric to break the cycle of cough ?  ?Wants to see ENT ?Past Medical History:  ?Diagnosis Date  ? ADHD (attention deficit hyperactivity disorder)   ? Allergies   ? Asthma   ? Sleep apnea   ? ? ?Past Surgical History:  ?Procedure Laterality Date  ? ESOPHAGOGASTRODUODENOSCOPY (EGD) WITH PROPOFOL N/A 05/03/2017  ? Procedure: ESOPHAGOGASTRODUODENOSCOPY (EGD) WITH PROPOFOL;  Surgeon: [name], MD;  Location: [facility] ENDOSCOPY;  Service: Gastroenterology;  Laterality: N/A;  ? FOREIGN BODY REMOVAL N/A 05/03/2017  ? Procedure: FOREIGN BODY REMOVAL;  Surgeon: [name], MD;  Location: [facility] ENDOSCOPY;  Service: Gastroenterology;  Laterality: N/A;  ? MANDIBLE SURGERY    ? TOE SURGERY    ? ? ?Family History  ?Problem Relation Age of Onset  ? Alcohol abuse Father   ? Drug abuse Paternal Uncle   ? ? ?Social History  ? ?Socioeconomic History  ? Marital status: Married  ?  Spouse name: Not on file  ? Number of children: 0  ?  Years of education: Not on file  ? Highest education level: High school graduate  ?Occupational History  ? Not on file  ?Tobacco Use  ? Smoking status: Never  ? Smokeless tobacco: Never  ?Vaping Use  ? Vaping Use: Never used  ?Substance and Sexual Activity  ? Alcohol use: Yes  ?  Comment: rare  ? Drug use: No  ? Sexual activity: Yes  ?Other Topics Concern  ? Not on file  ?Social History Narrative  ? Not on file  ? ?Social Determinants of Health  ? ?Financial Resource Strain: Not on file  ?Food Insecurity: Not on file  ?Transportation Needs: Not on file  ?Physical Activity: Not on file  ?Stress: Not on file  ?Social Connections: Not on file  ?Intimate Partner Violence: Not on file  ? ? ?Outpatient Medications Prior to Visit  ?Medication Sig Dispense Refill  ? albuterol (VENTOLIN HFA) 108 (90 Base) MCG/ACT inhaler     ? azelastine (ASTELIN) 0.1 % nasal spray     ? azelastine (OPTIVAR) 0.05 % ophthalmic solution 1 drop into affected eye    ? azelastine (OPTIVAR) 0.05 % ophthalmic solution     ? cetirizine (ZYRTEC) 10 MG tablet     ? Cetirizine HCl 10 MG CAPS Take by mouth.    ? citalopram (CELEXA) 40 MG tablet Take 1 tablet (40 mg total) by mouth daily. (  Patient not taking: Reported on 08/30/2021) 90 tablet 0  ? EPINEPHrine 0.3 mg/0.3 mL IJ SOAJ injection USE AS DIRECTED AS NEEDED  1  ? EQ ALLERGY RELIEF, CETIRIZINE, 10 MG tablet Take 10 mg by mouth daily.    ? ipratropium (ATROVENT) 0.03 % nasal spray Place 2 sprays into both nostrils every 12 (twelve) hours. 30 mL 3  ? lidocaine (XYLOCAINE) 2 % solution 15 mL.    ? montelukast (SINGULAIR) 10 MG tablet     ? omeprazole (PRILOSEC) 40 MG capsule Take 1 capsule (40 mg total) by mouth daily. 30 capsule 3  ? ?No facility-administered medications prior to visit.  ? ? ?Allergies  ?Allergen Reactions  ? Barley Grass   ? Corn-Containing Products   ? Dust Mite Extract   ? Other   ?  Tree grass wheat  ? Peanut-Containing Drug Products   ? Pollen Extract   ? Tomato    ? ? ?ROS ?Review of Systems ? ?  ?Objective:  ?  ?Physical Exam ? ?There were no vitals taken for this visit. ?Wt Readings from Last 3 Encounters:  ?08/30/21 (!) 339 lb 14.4 oz (154.2 kg)  ?03/10/21 (!) 355 lb (161 kg)  ?03/20/20 (!) 316 lb (143.3 kg)  ? ? ? ?Health Maintenance Due  ?Topic Date Due  ? COVID-19 Vaccine (1) Never done  ? Hepatitis C Screening  Never done  ? INFLUENZA VACCINE  01/23/2021  ? ? ?There are no preventive care reminders to display for this patient. ? ?Lab Results  ?Component Value Date  ? TSH 0.847 09/03/2018  ? ?Lab Results  ?Component Value Date  ? WBC 8.5 03/06/2019  ? HGB 16.3 03/06/2019  ? HCT 50.0 03/06/2019  ? MCV 91.9 03/06/2019  ? PLT 230 03/06/2019  ? ?Lab Results  ?Component Value Date  ? NA 142 03/06/2019  ? K 3.9 03/06/2019  ? CO2 27 03/06/2019  ? GLUCOSE 92 03/06/2019  ? BUN 16 03/06/2019  ? CREATININE 1.03 03/06/2019  ? BILITOT 0.7 03/06/2019  ? ALKPHOS 71 03/06/2019  ? AST 20 03/06/2019  ? ALT 24 03/06/2019  ? PROT 8.3 (H) 03/06/2019  ? ALBUMIN 4.2 03/06/2019  ? CALCIUM 9.0 03/06/2019  ? ANIONGAP 8 03/06/2019  ? ?Lab Results  ?Component Value Date  ? CHOL 215 (H) 09/03/2018  ? ?Lab Results  ?Component Value Date  ? HDL 30 (L) 09/03/2018  ? ?Lab Results  ?Component Value Date  ? LDLCALC 136 (H) 09/03/2018  ? ?Lab Results  ?Component Value Date  ? TRIG 244 (H) 09/03/2018  ? ?Lab Results  ?Component Value Date  ? CHOLHDL 7.2 (H) 09/03/2018  ? ?Lab Results  ?Component Value Date  ? HGBA1C 6.8 (A) 12/21/2019  ? ? ?  ?Assessment & Plan:  ? ?Problem List Items Addressed This Visit   ?None ? ? ?No orders of the defined types were placed in this encounter. ? ? ?Follow-up: No follow-ups on file.  ? ? ?[name], PA-C ?

## 2021-09-22 ENCOUNTER — Telehealth: Payer: Self-pay

## 2021-09-22 ENCOUNTER — Ambulatory Visit: Payer: Commercial Managed Care - PPO | Admitting: Physician Assistant

## 2021-09-22 NOTE — Telephone Encounter (Signed)
Copied from [place] [id number]. Topic: Appointment Scheduling - Scheduling Inquiry for Clinic ?>> Sep 15, 2021  9:36 AM Oneta Rack wrote: ?Reason for CRM: patient would like a confirmation call to confirm his appointment scheduled for 09/22/2021 with PCP. Patient states he did not receive a reminder call for his 09/13/2021 but did receive a e mail alert which he does not check his emails. ?

## 2021-09-29 ENCOUNTER — Ambulatory Visit: Payer: Commercial Managed Care - PPO | Admitting: Physician Assistant

## 2021-09-29 ENCOUNTER — Encounter: Payer: Self-pay | Admitting: Physician Assistant

## 2021-09-29 VITALS — BP 124/75 | HR 74 | Temp 97.7°F | Resp 16 | Wt 349.0 lb

## 2021-09-29 DIAGNOSIS — R053 Chronic cough: Secondary | ICD-10-CM

## 2021-09-29 NOTE — Progress Notes (Signed)
?  ? ?I,[name],acting as a Education administrator for [employer], PA-C.,have documented all relevant documentation on the behalf of [name], PA-C,as directed by  [employer], PA-C while in the presence of [employer], PA-C. ? ? ?Established patient visit ? ? ?Patient: Steven Carrillo   DOB: [birth year]   36 y.o. Male  MRN: [id number] ?Visit Date: 09/29/2021 ? ?Today's healthcare provider: [name], PA-C  ? ?Chief Complaint  ?Patient presents with  ? Cough  ? ?Subjective  ?  ?Patient presents with a continued cough for the past 3 mo. Reports much improvement .  ?However, reports having nasal congestion , runny nose and "inflamed sinuses"  during current allergy season. He is/taking montelukast and cetirizine. Patient is requesting a  ?referral to ENT for further investigation of chronic cough/inflamed sinuses.  ? ?Medications: ?Outpatient Medications Prior to Visit  ?Medication Sig  ? albuterol (VENTOLIN HFA) 108 (90 Base) MCG/ACT inhaler   ? azelastine (ASTELIN) 0.1 % nasal spray   ? azelastine (OPTIVAR) 0.05 % ophthalmic solution 1 drop into affected eye  ? azelastine (OPTIVAR) 0.05 % ophthalmic solution   ? cetirizine (ZYRTEC) 10 MG tablet   ? Cetirizine HCl 10 MG CAPS Take by mouth.  ? citalopram (CELEXA) 40 MG tablet Take 1 tablet (40 mg total) by mouth daily.  ? EPINEPHrine 0.3 mg/0.3 mL IJ SOAJ injection USE AS DIRECTED AS NEEDED  ? EQ ALLERGY RELIEF, CETIRIZINE, 10 MG tablet Take 10 mg by mouth daily.  ? ipratropium (ATROVENT) 0.03 % nasal spray Place 2 sprays into both nostrils every 12 (twelve) hours.  ? lidocaine (XYLOCAINE) 2 % solution 15 mL.  ? montelukast (SINGULAIR) 10 MG tablet   ? omeprazole (PRILOSEC) 40 MG capsule Take 1 capsule (40 mg total) by mouth daily.  ? ?No facility-administered medications prior to visit.  ? ? ?Review of Systems  ?Constitutional:  Negative for chills and fever.  ?HENT:  Positive for congestion, postnasal drip and rhinorrhea.   ?Eyes:  Negative for visual disturbance.   ?Respiratory: Negative.    ?Cardiovascular: Negative.   ?Neurological:  Positive for numbness.  ?Psychiatric/Behavioral: Negative.    ? ? ?  Objective  ?  ?BP 124/75 (BP Location: Right Arm, Patient Position: Sitting, Cuff Size: Large)   Pulse 74   Temp 97.7 ?F (36.5 ?C) (Oral)   Resp 16   Wt (!) 349 lb (158.3 kg)   SpO2 97%   BMI 46.04 kg/m?  ? ? ?Physical Exam ?Vitals and nursing note reviewed.  ?Constitutional:   ?   Appearance: Normal appearance.  ?HENT:  ?   Head: Normocephalic and atraumatic.  ?   Right Ear: Tympanic membrane normal.  ?   Left Ear: Tympanic membrane normal.  ?   Nose: Congestion and rhinorrhea present.  ?   Mouth/Throat:  ?   Mouth: Mucous membranes are moist.  ?   Pharynx: Oropharynx is clear.  ?Eyes:  ?   Extraocular Movements: Extraocular movements intact.  ?   Conjunctiva/sclera: Conjunctivae normal.  ?Cardiovascular:  ?   Rate and Rhythm: Normal rate and regular rhythm.  ?   Pulses: Normal pulses.  ?Pulmonary:  ?   Effort: Pulmonary effort is normal.  ?   Breath sounds: Normal breath sounds.  ?Musculoskeletal:  ?   Right lower leg: No edema.  ?   Left lower leg: No edema.  ?Neurological:  ?   General: No focal deficit present.  ?   Mental Status: He is alert and  oriented to person, place, and time.  ?Psychiatric:     ?   Mood and Affect: Mood normal.     ?   Behavior: Behavior normal.     ?   Thought Content: Thought content normal.     ?   Judgment: Judgment normal.  ?  ? Assessment & Plan  ?  ? ?1. Chronic cough x > 28mo?- Improved but for his current symptoms it could be 2/2 to allergic rhino sinusitis, still has to continue to use antihistamines and intranasal GC during spring season. ?Hx of asthma - continue albuterol as needed, OSA - use CPAP.  ?Could not be a medication side effects as he does not take ACEI. Does not smoke and no hx of smoking. ?Continue ipratropium (ATROVENT) 0.03 % nasal spray; Place 2 sprays into both nostrils every 12 (twelve) hours.  Dispense: 30 mL;  Refill: 3  ?Recommended saline nasal rinses, humidifier(steamed vapor) ?Follow-up with his Allergist ? ?Might be due to GERD ?Heartburn, hx of  eosinophilic esophagitis ?Continue omeprazole (PRILOSEC) 40 MG capsule; Take 1 capsule (40 mg total) by mouth daily.  Dispense: 30 capsule; Refill: 3 ?Raise the head of bed, avoid trigger food, avoid immediate reclining after a meal ?  ?Turmeric to break the cycle of cough ?- Ambulatory referral to ENT ? ?CPE in 1 mo ? ?The patient was advised to call back or seek an in-person evaluation if the symptoms worsen or if the condition fails to improve as anticipated. ? ?I discussed the assessment and treatment plan with the patient. The patient was provided an opportunity to ask questions and all were answered. The patient agreed with the plan and demonstrated an understanding of the instructions. ? ?The entirety of the information documented in the History of Present Illness, Review of Systems and Physical Exam were personally obtained by me. Portions of this information were initially documented by the CMA and reviewed by me for thoroughness and accuracy.   ? ? ?JMardene Speak PA-C  ?[place]?[phone](phone) ?[phone](fax) ? ?[facility] ?

## 2021-11-16 NOTE — Progress Notes (Signed)
I,Steven Carrillo,acting as a Education administrator for [employer], PA-C.,have documented all relevant documentation on the behalf of Steven Speak, PA-C,as directed by  [employer], PA-C while in the presence of [employer], PA-C.   Complete physical exam   Patient: Steven Carrillo   DOB: Sep 14, 1985   36 y.o. Male  MRN: [id number] Visit Date: 11/17/2021  Today's healthcare provider: [name], PA-C   Chief Complaint  Patient presents with   Annual Exam   Subjective    Steven Carrillo is a 36 y.o. male who presents today for a complete physical exam.  He reports consuming a general diet. The patient has a physically strenuous job, but has no regular exercise apart from work.  He generally feels fairly well. He reports sleeping fairly well. He does have additional problems to discuss today. Patient wants to understand why he getting sick so often.     Past Medical History:  Diagnosis Date   ADHD (attention deficit hyperactivity disorder)    Allergies    Asthma    Sleep apnea    Past Surgical History:  Procedure Laterality Date   ESOPHAGOGASTRODUODENOSCOPY (EGD) WITH PROPOFOL N/A 05/03/2017   Procedure: ESOPHAGOGASTRODUODENOSCOPY (EGD) WITH PROPOFOL;  Surgeon: [name], MD;  Location: [facility] ENDOSCOPY;  Service: Gastroenterology;  Laterality: N/A;   FOREIGN BODY REMOVAL N/A 05/03/2017   Procedure: FOREIGN BODY REMOVAL;  Surgeon: [name], MD;  Location: [facility] ENDOSCOPY;  Service: Gastroenterology;  Laterality: N/A;   MANDIBLE SURGERY     TOE SURGERY     Social History   Socioeconomic History   Marital status: Married    Spouse name: Not on file   Number of children: 0   Years of education: Not on file   Highest education level: High school graduate  Occupational History   Not on file  Tobacco Use   Smoking status: Never   Smokeless tobacco: Never  Vaping Use   Vaping Use: Never used  Substance and Sexual Activity   Alcohol use: Yes    Comment: rare   Drug use: No    Sexual activity: Yes  Other Topics Concern   Not on file  Social History Narrative   Not on file   Social Determinants of Health   Financial Resource Strain: Not on file  Food Insecurity: Not on file  Transportation Needs: Not on file  Physical Activity: Not on file  Stress: Not on file  Social Connections: Not on file  Intimate Partner Violence: Not on file   Family Status  Relation Name Status   Mother  Alive   Father  Alive   Sister  Alive   Brother  Alive   Psychiatrist  (Not Specified)   Sister  Alive   Sister  Alive   Brother  Alive   Brother  Alive   Family History  Problem Relation Age of Onset   Alcohol abuse Father    Drug abuse Paternal Uncle    Allergies  Allergen Reactions   Barley Grass    Corn-Containing Products    Dust Mite Extract    Other     Tree grass wheat   Peanut-Containing Drug Products    Pollen Extract    Tomato     Patient Care Team: Elberta Leatherwood as PCP - General (Physician Assistant)   Medications: Outpatient Medications Prior to Visit  Medication Sig   albuterol (VENTOLIN HFA) 108 (90 Base) MCG/ACT inhaler    azelastine (ASTELIN) 0.1 % nasal  spray    azelastine (OPTIVAR) 0.05 % ophthalmic solution 1 drop into affected eye   azelastine (OPTIVAR) 0.05 % ophthalmic solution    cetirizine (ZYRTEC) 10 MG tablet    Cetirizine HCl 10 MG CAPS Take by mouth.   EPINEPHrine 0.3 mg/0.3 mL IJ SOAJ injection USE AS DIRECTED AS NEEDED   EQ ALLERGY RELIEF, CETIRIZINE, 10 MG tablet Take 10 mg by mouth daily.   lidocaine (XYLOCAINE) 2 % solution 15 mL.   montelukast (SINGULAIR) 10 MG tablet    omeprazole (PRILOSEC) 40 MG capsule Take 1 capsule (40 mg total) by mouth daily.   citalopram (CELEXA) 40 MG tablet Take 1 tablet (40 mg total) by mouth daily. (Patient not taking: Reported on 11/17/2021)   ipratropium (ATROVENT) 0.03 % nasal spray Place 2 sprays into both nostrils every 12 (twelve) hours. (Patient not taking: Reported on 11/17/2021)    No facility-administered medications prior to visit.    Review of Systems  Constitutional:  Positive for fatigue.  HENT:  Positive for sneezing.   Eyes:  Positive for photophobia.  Musculoskeletal:  Positive for neck pain and neck stiffness.  Allergic/Immunologic: Positive for environmental allergies and food allergies.  Neurological:  Positive for headaches.  Psychiatric/Behavioral:  Positive for agitation. The patient is nervous/anxious.   All other systems reviewed and are negative.    Objective     BP 123/81 (BP Location: Left Arm, Patient Position: Sitting, Cuff Size: Large)   Pulse 68   Temp 98.1 F (36.7 C) (Oral)   Resp 16   Wt (!) 347 lb 9.6 oz (157.7 kg)   SpO2 100%   BMI 45.86 kg/m     Physical Exam Vitals reviewed.  Constitutional:      General: He is not in acute distress.    Appearance: Normal appearance. He is well-developed. He is obese. He is not diaphoretic.  HENT:     Head: Normocephalic and atraumatic.     Right Ear: Tympanic membrane, ear canal and external ear normal.     Left Ear: Tympanic membrane, ear canal and external ear normal.     Nose: Nose normal.     Mouth/Throat:     Mouth: Mucous membranes are moist.     Pharynx: Oropharynx is clear. No oropharyngeal exudate.  Eyes:     General: No scleral icterus.    Conjunctiva/sclera: Conjunctivae normal.     Pupils: Pupils are equal, round, and reactive to light.  Neck:     Thyroid: No thyromegaly.  Cardiovascular:     Rate and Rhythm: Normal rate and regular rhythm.     Pulses: Normal pulses.     Heart sounds: Normal heart sounds. No murmur heard.   Friction rub: .jp.  Pulmonary:     Effort: Pulmonary effort is normal. No respiratory distress.     Breath sounds: Normal breath sounds. No wheezing or rales.  Abdominal:     General: There is no distension.     Palpations: Abdomen is soft.     Tenderness: There is no abdominal tenderness.  Musculoskeletal:        General: No  deformity.     Cervical back: Neck supple.     Right lower leg: No edema.     Left lower leg: No edema.  Lymphadenopathy:     Cervical: No cervical adenopathy.  Skin:    General: Skin is warm and dry.     Findings: No rash.  Neurological:     Mental Status: He is  alert and oriented to person, place, and time. Mental status is at baseline.     Sensory: No sensory deficit.     Motor: No weakness.     Gait: Gait normal.  Psychiatric:        Mood and Affect: Mood normal.        Behavior: Behavior normal.        Thought Content: Thought content normal.      Last depression screening scores    11/17/2021    8:50 AM 03/10/2021   10:10 AM 10/15/2019    6:21 PM  PHQ 2/9 Scores  PHQ - 2 Score 2 1   PHQ- 9 Score 7    Exception Documentation   Patient refusal   Last fall risk screening    11/17/2021    8:50 AM  [place] in the past year? 0  Number falls in past yr: 0  Injury with Fall? 0  Follow up Falls evaluation completed   Last Audit-C alcohol use screening    11/17/2021    8:51 AM  Alcohol Use Disorder Test (AUDIT)  1. How often do you have a drink containing alcohol? 1  2. How many drinks containing alcohol do you have on a typical day when you are drinking? 0  3. How often do you have six or more drinks on one occasion? 1  AUDIT-C Score 2   A score of 3 or more in women, and 4 or more in men indicates increased risk for alcohol abuse, EXCEPT if all of the points are from question 1   No results found for any visits on 11/17/21.  Assessment & Plan    Routine Health Maintenance and Physical Exam  Exercise Activities and Dietary recommendations Advised on healthy diet and daily exercise Recommended relaxation techniques, stress reducing activities   Immunization History  Administered Date(s) Administered   Hepatitis B 04/11/1998, 05/24/1998, 10/10/1998   Tdap 09/03/2018    Health Maintenance  Topic Date Due   COVID-19 Vaccine (1) 12/03/2021  (Originally 08/02/1986)   INFLUENZA VACCINE  01/23/2022   TETANUS/TDAP  09/02/2028   Hepatitis C Screening  Completed   HIV Screening  Completed   HPV VACCINES  Aged Out    Discussed health benefits of physical activity, and encouraged him to engage in regular exercise appropriate for his age and condition.  1. Annual physical exam  - Comprehensive metabolic panel - TSH - Lipid panel - Hepatitis C Antibody Eye exam will be scheduled in July Dental exam was a few weeks ago for wisdom tooth, teeth cleaning was scheduled for the next week.  2. Encounter for hepatitis C screening test for low risk patient  - Hepatitis C Antibody  3. Class 3 severe obesity due to excess calories with serious comorbidity and body mass index (BMI) of 45.0 to 49.9 in adult [facility])  - Comprehensive metabolic panel - TSH - Lipid panel - Hepatitis C Antibody - Amb Ref to Medical Weight Management - Amb Referral to Bariatric Surgery   FU in 1 mo for chronic med conditions Needs schedule annual exam    The patient was advised to call back or seek an in-person evaluation if the symptoms worsen or if the condition fails to improve as anticipated.  I discussed the assessment and treatment plan with the patient. The patient was provided an opportunity to ask questions and all were answered. The patient agreed with the plan and demonstrated an understanding of the instructions.  The entirety of the information documented in the History of Present Illness, Review of Systems and Physical Exam were personally obtained by me. Portions of this information were initially documented by the CMA and reviewed by me for thoroughness and accuracy.  Portions of this note were created using dictation software and may contain typographical errors.     Steven Speak, PA-C  [facility] [phone] (phone) [phone] (fax)  [place]

## 2021-11-17 ENCOUNTER — Ambulatory Visit (INDEPENDENT_AMBULATORY_CARE_PROVIDER_SITE_OTHER): Payer: Commercial Managed Care - PPO | Admitting: Physician Assistant

## 2021-11-17 ENCOUNTER — Encounter: Payer: Self-pay | Admitting: Physician Assistant

## 2021-11-17 VITALS — BP 123/81 | HR 68 | Temp 98.1°F | Resp 16 | Wt 347.6 lb

## 2021-11-17 DIAGNOSIS — R053 Chronic cough: Secondary | ICD-10-CM

## 2021-11-17 DIAGNOSIS — Z1159 Encounter for screening for other viral diseases: Secondary | ICD-10-CM | POA: Insufficient documentation

## 2021-11-17 DIAGNOSIS — Z0000 Encounter for general adult medical examination without abnormal findings: Secondary | ICD-10-CM

## 2021-11-17 DIAGNOSIS — Z6841 Body Mass Index (BMI) 40.0 and over, adult: Secondary | ICD-10-CM

## 2021-11-18 LAB — LIPID PANEL
Chol/HDL Ratio: 5.9 ratio — ABNORMAL HIGH (ref 0.0–5.0)
Cholesterol, Total: 183 mg/dL (ref 100–199)
HDL: 31 mg/dL — ABNORMAL LOW (ref >39)
LDL Chol Calc (NIH): 97 mg/dL (ref 0–99)
Triglycerides: 323 mg/dL — ABNORMAL HIGH (ref 0–149)
VLDL Cholesterol Cal: 55 mg/dL — ABNORMAL HIGH (ref 5–40)

## 2021-11-18 LAB — COMPREHENSIVE METABOLIC PANEL
ALT: 25 IU/L (ref 0–44)
AST: 19 IU/L (ref 0–40)
Albumin/Globulin Ratio: 1.6 (ref 1.2–2.2)
Albumin: 4.1 g/dL (ref 4.0–5.0)
Alkaline Phosphatase: 68 IU/L (ref 44–121)
BUN/Creatinine Ratio: 15 (ref 9–20)
BUN: 15 mg/dL (ref 6–20)
Bilirubin Total: 0.3 mg/dL (ref 0.0–1.2)
CO2: 26 mmol/L (ref 20–29)
Calcium: 9 mg/dL (ref 8.7–10.2)
Chloride: 102 mmol/L (ref 96–106)
Creatinine, Ser: 1.02 mg/dL (ref 0.76–1.27)
Globulin, Total: 2.6 g/dL (ref 1.5–4.5)
Glucose: 117 mg/dL — ABNORMAL HIGH (ref 70–99)
Potassium: 4.5 mmol/L (ref 3.5–5.2)
Sodium: 140 mmol/L (ref 134–144)
Total Protein: 6.7 g/dL (ref 6.0–8.5)
eGFR: 98 mL/min/{1.73_m2} (ref >59)

## 2021-11-18 LAB — TSH: TSH: 1.32 u[IU]/mL (ref 0.450–4.500)

## 2021-11-18 LAB — HEPATITIS C ANTIBODY: Hep C Virus Ab: NONREACTIVE

## 2021-12-11 NOTE — Progress Notes (Unsigned)
      I,Roshena L Chambers,acting as a Education administrator for [employer], PA-C.,have documented all relevant documentation on the behalf of [name], PA-C,as directed by  [employer], PA-C while in the presence of [employer], PA-C.   Established patient visit   Patient: Steven Carrillo   DOB: 06-Jul-1985   36 y.o. Male  MRN: [id number] Visit Date: 12/12/2021  Today's healthcare provider: [name], PA-C   Chief Complaint  Patient presents with   Insect Bite   Subjective    HPI  Insect Bite: Patient reports that he was bitten by a tick on the left ankle 1 month ago. The tick bite became bed with small bumps around it. He developed a red itchy rash on his upper arms 1 week ago. He has been applying calamine lotion to the rash.   Medications: Outpatient Medications Prior to Visit  Medication Sig   albuterol (VENTOLIN HFA) 108 (90 Base) MCG/ACT inhaler    azelastine (ASTELIN) 0.1 % nasal spray    azelastine (OPTIVAR) 0.05 % ophthalmic solution 1 drop into affected eye   cetirizine (ZYRTEC) 10 MG tablet    Cetirizine HCl 10 MG CAPS Take by mouth.   citalopram (CELEXA) 40 MG tablet Take 1 tablet (40 mg total) by mouth daily.   EPINEPHrine 0.3 mg/0.3 mL IJ SOAJ injection USE AS DIRECTED AS NEEDED   EQ ALLERGY RELIEF, CETIRIZINE, 10 MG tablet Take 10 mg by mouth daily.   ipratropium (ATROVENT) 0.03 % nasal spray Place 2 sprays into both nostrils every 12 (twelve) hours.   lidocaine (XYLOCAINE) 2 % solution 15 mL.   montelukast (SINGULAIR) 10 MG tablet    omeprazole (PRILOSEC) 40 MG capsule Take 1 capsule (40 mg total) by mouth daily.   [DISCONTINUED] azelastine (OPTIVAR) 0.05 % ophthalmic solution    No facility-administered medications prior to visit.    Review of Systems  Constitutional:  Negative for appetite change, chills and fever.  Respiratory:  Negative for chest tightness, shortness of breath and wheezing.   Cardiovascular:  Negative for chest pain and palpitations.   Gastrointestinal:  Negative for abdominal pain, nausea and vomiting.  Skin:  Positive for rash.    {Labs  Heme  Chem  Endocrine  Serology  Results Review (optional):23779}   Objective    BP 133/90 (BP Location: Left Arm, Patient Position: Sitting, Cuff Size: Large)   Pulse 74   Temp 97.9 F (36.6 C) (Oral)   Resp 20   Wt (!) 358 lb (162.4 kg)   SpO2 99% Comment: room air  BMI 47.23 kg/m  {Show previous vital signs (optional):23777}  Physical Exam  ***  No results found for any visits on 12/12/21.  Assessment & Plan     ***  No follow-ups on file.      {provider attestation***:1}   [name], Hershal Coria  [facility] [phone] (phone) [phone] (fax)  [place]

## 2021-12-12 ENCOUNTER — Ambulatory Visit: Payer: Commercial Managed Care - PPO | Admitting: Physician Assistant

## 2021-12-12 ENCOUNTER — Encounter: Payer: Self-pay | Admitting: Physician Assistant

## 2021-12-12 VITALS — BP 133/90 | HR 74 | Temp 97.9°F | Resp 20 | Wt 358.0 lb

## 2021-12-12 DIAGNOSIS — R21 Rash and other nonspecific skin eruption: Secondary | ICD-10-CM

## 2021-12-12 MED ORDER — TRIAMCINOLONE ACETONIDE 0.1 % EX CREA: 1.0000 | TOPICAL_CREAM | Freq: Two times a day (BID) | CUTANEOUS | 0 refills | Status: DC

## 2021-12-13 LAB — LYME IGG/IGM
Lyme IgG EIA: NEGATIVE
Lyme IgM EIA: NEGATIVE

## 2021-12-13 LAB — LYME DISEASE SEROLOGY W/REFLEX: Lyme Total Antibody EIA: POSITIVE

## 2021-12-14 ENCOUNTER — Encounter: Payer: Self-pay | Admitting: Physician Assistant

## 2021-12-14 ENCOUNTER — Other Ambulatory Visit: Payer: Self-pay | Admitting: Physician Assistant

## 2021-12-14 ENCOUNTER — Telehealth: Payer: Self-pay

## 2021-12-14 DIAGNOSIS — R6889 Other general symptoms and signs: Secondary | ICD-10-CM

## 2021-12-14 MED ORDER — AMOXICILLIN 500 MG PO CAPS: 500.0000 mg | ORAL_CAPSULE | Freq: Three times a day (TID) | ORAL | 0 refills | Status: DC

## 2021-12-14 MED ORDER — AMOXICILLIN 500 MG PO CAPS: ORAL_CAPSULE | ORAL | 0 refills | Status: DC

## 2021-12-14 NOTE — Progress Notes (Signed)
Spoke with pt via phone. An antibiotics was sent to his pharmacy. Instruction was provided how to take this medication. Reasoning behind Rx this medication was explained. Pt expressed his understanding and agreed to proceed with the above mentioned treatment plan. Will fu after completion of a course of abx

## 2021-12-14 NOTE — Telephone Encounter (Signed)
Copied from [place] [id number]. Topic: General - Inquiry >> Dec 14, 2021  8:36 AM Steven Carrillo wrote: Reason for CRM: Pt requesting a call back to discuss labs.  Pt is concerned and is requesting a call back as soon as possible.

## 2021-12-14 NOTE — Telephone Encounter (Signed)
Pt is calling back to see why abx for lyme disease was not sent into [facility] pharmacy like he talked with Letitia Libra, PA about earlier today. Pt was very upset and said this should have been handled and him not having to call back. He doesn't want a call back. He wants someone to send him a Mychart message letting him know that abx was sent in. Advised him I will send message [facility].

## 2021-12-15 ENCOUNTER — Other Ambulatory Visit: Payer: Self-pay | Admitting: *Deleted

## 2021-12-15 ENCOUNTER — Encounter: Payer: Self-pay | Admitting: *Deleted

## 2021-12-15 DIAGNOSIS — R6889 Other general symptoms and signs: Secondary | ICD-10-CM

## 2021-12-15 MED ORDER — AMOXICILLIN 500 MG PO CAPS: ORAL_CAPSULE | ORAL | 0 refills | Status: DC

## 2021-12-15 NOTE — Telephone Encounter (Signed)
Resent rx

## 2021-12-22 ENCOUNTER — Ambulatory Visit: Payer: Commercial Managed Care - PPO | Admitting: Physician Assistant

## 2022-01-02 ENCOUNTER — Ambulatory Visit (INDEPENDENT_AMBULATORY_CARE_PROVIDER_SITE_OTHER): Payer: Commercial Managed Care - PPO | Admitting: Family Medicine

## 2022-01-02 ENCOUNTER — Encounter: Payer: Self-pay | Admitting: Family Medicine

## 2022-01-02 ENCOUNTER — Ambulatory Visit: Payer: Self-pay | Admitting: *Deleted

## 2022-01-02 VITALS — BP 107/72 | HR 87 | Resp 16 | Wt 353.0 lb

## 2022-01-02 DIAGNOSIS — Z9989 Dependence on other enabling machines and devices: Secondary | ICD-10-CM

## 2022-01-02 DIAGNOSIS — E66813 Obesity, class 3: Secondary | ICD-10-CM

## 2022-01-02 DIAGNOSIS — G4733 Obstructive sleep apnea (adult) (pediatric): Secondary | ICD-10-CM

## 2022-01-02 DIAGNOSIS — Z6841 Body Mass Index (BMI) 40.0 and over, adult: Secondary | ICD-10-CM | POA: Diagnosis not present

## 2022-01-02 DIAGNOSIS — R197 Diarrhea, unspecified: Secondary | ICD-10-CM | POA: Diagnosis not present

## 2022-01-02 NOTE — Progress Notes (Unsigned)
      Established patient visit  I,[name],acting as a scribe for Wilhemena Durie, MD.,have documented all relevant documentation on the behalf of Wilhemena Durie, MD,as directed by  Wilhemena Durie, MD while in the presence of Wilhemena Durie, MD.   Patient: Steven Carrillo   DOB: 1985/08/04   35 y.o. Male  MRN: [id number] Visit Date: 01/02/2022  Today's healthcare provider: Wilhemena Durie, MD   Chief Complaint  Patient presents with   Diarrhea   Subjective    HPI  Patient has had diarrhea for 2 days. Patient states he has no abdominal pain, no nausea. Patient states he went to [facility] last week. He did eat from a buffet. Patient has been treating symptoms with Pepto Bismol.   Medications: Outpatient Medications Prior to Visit  Medication Sig   albuterol (VENTOLIN HFA) 108 (90 Base) MCG/ACT inhaler    azelastine (ASTELIN) 0.1 % nasal spray    azelastine (OPTIVAR) 0.05 % ophthalmic solution 1 drop into affected eye   citalopram (CELEXA) 40 MG tablet Take 1 tablet (40 mg total) by mouth daily.   EPINEPHrine 0.3 mg/0.3 mL IJ SOAJ injection USE AS DIRECTED AS NEEDED   EQ ALLERGY RELIEF, CETIRIZINE, 10 MG tablet Take 10 mg by mouth daily.   ipratropium (ATROVENT) 0.03 % nasal spray Place 2 sprays into both nostrils every 12 (twelve) hours.   montelukast (SINGULAIR) 10 MG tablet    omeprazole (PRILOSEC) 40 MG capsule Take 1 capsule (40 mg total) by mouth daily.   [DISCONTINUED] amoxicillin (AMOXIL) 500 MG capsule Take 1 capsule by mouth 3(three)times daily for 14 days   [DISCONTINUED] cetirizine (ZYRTEC) 10 MG tablet    [DISCONTINUED] Cetirizine HCl 10 MG CAPS Take by mouth.   [DISCONTINUED] lidocaine (XYLOCAINE) 2 % solution 15 mL. (Patient not taking: Reported on 01/02/2022)   [DISCONTINUED] triamcinolone cream (KENALOG) 0.1 % Apply 1 Application topically 2 (two) times daily. (Patient not taking: Reported on 01/02/2022)   No facility-administered medications  prior to visit.    Review of Systems  Constitutional:  Negative for appetite change, chills and fever.  Respiratory:  Negative for chest tightness, shortness of breath and wheezing.   Cardiovascular:  Negative for chest pain and palpitations.  Gastrointestinal:  Negative for abdominal pain, nausea and vomiting.    Last CBC Lab Results  Component Value Date   WBC 8.5 03/06/2019   HGB 16.3 03/06/2019   HCT 50.0 03/06/2019   MCV 91.9 03/06/2019   MCH 30.0 03/06/2019   RDW 12.3 03/06/2019   PLT 230 03/06/2019       Objective    BP 107/72 (BP Location: Right Arm, Patient Position: Sitting, Cuff Size: Large)   Pulse 87   Resp 16   Wt (!) 353 lb (160.1 kg)   SpO2 96%   BMI 46.57 kg/m  BP Readings from Last 3 Encounters:  01/02/22 107/72  12/12/21 133/90  11/17/21 123/81   Wt Readings from Last 3 Encounters:  01/02/22 (!) 353 lb (160.1 kg)  12/12/21 (!) 358 lb (162.4 kg)  11/17/21 (!) 347 lb 9.6 oz (157.7 kg)      Physical Exam  ***  No results found for any visits on 01/02/22.  Assessment & Plan     ***  No follow-ups on file.      {provider attestation***:1}   Wilhemena Durie, MD  [facility] [phone] (phone) [phone] (fax)  [place]

## 2022-01-02 NOTE — Patient Instructions (Signed)
Push fluids and eat a bland diet while dealing with the loose stools/diarrhea.  Slowly advance your diet as you improve.

## 2022-01-02 NOTE — Telephone Encounter (Signed)
Pt is experiencing body aches and diarrhea / pt is not sure if it is related to a trip or lime disease / please advise       Chief Complaint: Diarrhea Symptoms: Watery diarrhea, 8-10 xs in 24 hr period. Completed course of ATBs on Thursday or Friday. Mild nausea Frequency: Sunday Pertinent Negatives: Patient denies abdominal pain, vomiting. NO s/s dehydration. Disposition: '[]'$ ED /'[]'$ Urgent Care (no appt availability in office) / '[x]'$ Appointment(In office/virtual)/ '[]'$  [facility]/ '[]'$ Home Care/ '[]'$ Refused Recommended Disposition /'[]'$ [place] [facility]/ '[]'$  Follow-up with PCP Additional Notes: Care advise provided,verbalizes understanding. Appt secured with Dr. Rosanna Randy after speaking with Anderson Malta. Reason for Disposition  [1] Recent antibiotic therapy (i.e., within last 2 months) AND [2] diarrhea present > 3 days since antibiotic was stopped  Answer Assessment - Initial Assessment Questions 1. DIARRHEA SEVERITY: "How bad is the diarrhea?" "How many more stools have you had in the past 24 hours than normal?"    - NO DIARRHEA (SCALE 0)   - MILD (SCALE 1-3): Few loose or mushy BMs; increase of 1-3 stools over normal daily number of stools; mild increase in ostomy output.   -  MODERATE (SCALE 4-7): Increase of 4-6 stools daily over normal; moderate increase in ostomy output. * SEVERE (SCALE 8-10; OR 'WORST POSSIBLE'): Increase of 7 or more stools daily over normal; moderate increase in ostomy output; incontinence.     8-10 times 2. ONSET: "When did the diarrhea begin?"      Sunday 3. BM CONSISTENCY: "How loose or watery is the diarrhea?"      Watery 4. VOMITING: "Are you also vomiting?" If Yes, ask: "How many times in the past 24 hours?"      no 5. ABDOMINAL PAIN: "Are you having any abdominal pain?" If Yes, ask: "What does it feel like?" (e.g., crampy, dull, intermittent, constant)      Slight nausea, no pain 6. ABDOMINAL PAIN SEVERITY: If present, ask: "How bad is the pain?"   (e.g., Scale 1-10; mild, moderate, or severe)   - MILD (1-3): doesn't interfere with normal activities, abdomen soft and not tender to touch    - MODERATE (4-7): interferes with normal activities or awakens from sleep, abdomen tender to touch    - SEVERE (8-10): excruciating pain, doubled over, unable to do any normal activities       no 7. ORAL INTAKE: If vomiting, "Have you been able to drink liquids?" "How much liquids have you had in the past 24 hours?"     NA 8. HYDRATION: "Any signs of dehydration?" (e.g., dry mouth [not just dry lips], too weak to stand, dizziness, new weight loss) "When did you last urinate?"     States staying hydrated 9. EXPOSURE: "Have you traveled to a foreign country recently?" "Have you been exposed to anyone with diarrhea?" "Could you have eaten any food that was spoiled?"     no 10. ANTIBIOTIC USE: "Are you taking antibiotics now or have you taken antibiotics in the past 2 months?"       Yes, completed Thursday or Friday last week 11. OTHER SYMPTOMS: "Do you have any other symptoms?" (e.g., fever, blood in stool)       Nausea Sunday, no vomiting  Protocols used: Diarrhea-A-AH

## 2022-01-03 LAB — COMPREHENSIVE METABOLIC PANEL
ALT: 37 IU/L (ref 0–44)
AST: 25 IU/L (ref 0–40)
Albumin/Globulin Ratio: 1.6 (ref 1.2–2.2)
Albumin: 4.4 g/dL (ref 4.1–5.1)
Alkaline Phosphatase: 68 IU/L (ref 44–121)
BUN/Creatinine Ratio: 9 (ref 9–20)
BUN: 11 mg/dL (ref 6–20)
Bilirubin Total: 0.4 mg/dL (ref 0.0–1.2)
CO2: 19 mmol/L — ABNORMAL LOW (ref 20–29)
Calcium: 8.4 mg/dL — ABNORMAL LOW (ref 8.7–10.2)
Chloride: 105 mmol/L (ref 96–106)
Creatinine, Ser: 1.19 mg/dL (ref 0.76–1.27)
Globulin, Total: 2.8 g/dL (ref 1.5–4.5)
Glucose: 104 mg/dL — ABNORMAL HIGH (ref 70–99)
Potassium: 4.1 mmol/L (ref 3.5–5.2)
Sodium: 142 mmol/L (ref 134–144)
Total Protein: 7.2 g/dL (ref 6.0–8.5)
eGFR: 82 mL/min/{1.73_m2} (ref >59)

## 2022-01-03 LAB — CBC WITH DIFFERENTIAL/PLATELET
Basophils Absolute: 0 10*3/uL (ref 0.0–0.2)
Basos: 1 %
EOS (ABSOLUTE): 0.3 10*3/uL (ref 0.0–0.4)
Eos: 5 %
Hematocrit: 48.3 % (ref 37.5–51.0)
Hemoglobin: 16.7 g/dL (ref 13.0–17.7)
Immature Grans (Abs): 0.1 10*3/uL (ref 0.0–0.1)
Immature Granulocytes: 1 %
Lymphocytes Absolute: 1.5 10*3/uL (ref 0.7–3.1)
Lymphs: 22 %
MCH: 30.1 pg (ref 26.6–33.0)
MCHC: 34.6 g/dL (ref 31.5–35.7)
MCV: 87 fL (ref 79–97)
Monocytes Absolute: 0.6 10*3/uL (ref 0.1–0.9)
Monocytes: 9 %
Neutrophils Absolute: 4.3 10*3/uL (ref 1.4–7.0)
Neutrophils: 62 %
Platelets: 199 10*3/uL (ref 150–450)
RBC: 5.55 x10E6/uL (ref 4.14–5.80)
RDW: 13.4 % (ref 11.6–15.4)
WBC: 6.8 10*3/uL (ref 3.4–10.8)

## 2022-02-16 ENCOUNTER — Telehealth: Payer: Self-pay | Admitting: Physician Assistant

## 2022-02-16 ENCOUNTER — Other Ambulatory Visit: Payer: Self-pay

## 2022-02-16 DIAGNOSIS — R059 Cough, unspecified: Secondary | ICD-10-CM

## 2022-02-16 MED ORDER — OMEPRAZOLE 40 MG PO CPDR: 40.0000 mg | DELAYED_RELEASE_CAPSULE | Freq: Every day | ORAL | 3 refills | Status: AC

## 2022-02-16 MED ORDER — MONTELUKAST SODIUM 10 MG PO TABS: 10.0000 mg | ORAL_TABLET | Freq: Every day | ORAL | 0 refills | Status: AC | PRN

## 2022-02-16 NOTE — Telephone Encounter (Signed)
[facility] faxed refill request for the following medications:  EQ ALLERGY RELIEF, CETIRIZINE, 10 MG tablet  montelukast (SINGULAIR) 10 MG tablet  omeprazole (PRILOSEC) 40 MG capsule   Please advise.

## 2022-02-16 NOTE — Telephone Encounter (Signed)
Rx's refilled. 

## 2022-02-19 ENCOUNTER — Other Ambulatory Visit: Payer: Self-pay

## 2022-02-19 ENCOUNTER — Telehealth: Payer: Self-pay | Admitting: Physician Assistant

## 2022-02-19 MED ORDER — EQ ALLERGY RELIEF (CETIRIZINE) 10 MG PO TABS: 10.0000 mg | ORAL_TABLET | Freq: Every day | ORAL | 3 refills | Status: AC

## 2022-02-19 NOTE — Telephone Encounter (Signed)
[facility] faxed refill request for the following medications:  EQ ALLERGY RELIEF, CETIRIZINE, 10 MG tablet   Please advise.

## 2022-06-18 IMAGING — CR DG CHEST 2V
2 series · 2 of 2 positions shown · non-contrast
Comparison: Radiograph 05/03/2017

CLINICAL DATA: [id number]-[id number] positive, evaluate for pneumonia

EXAM:
CHEST - 2 VIEW

[chest pa]
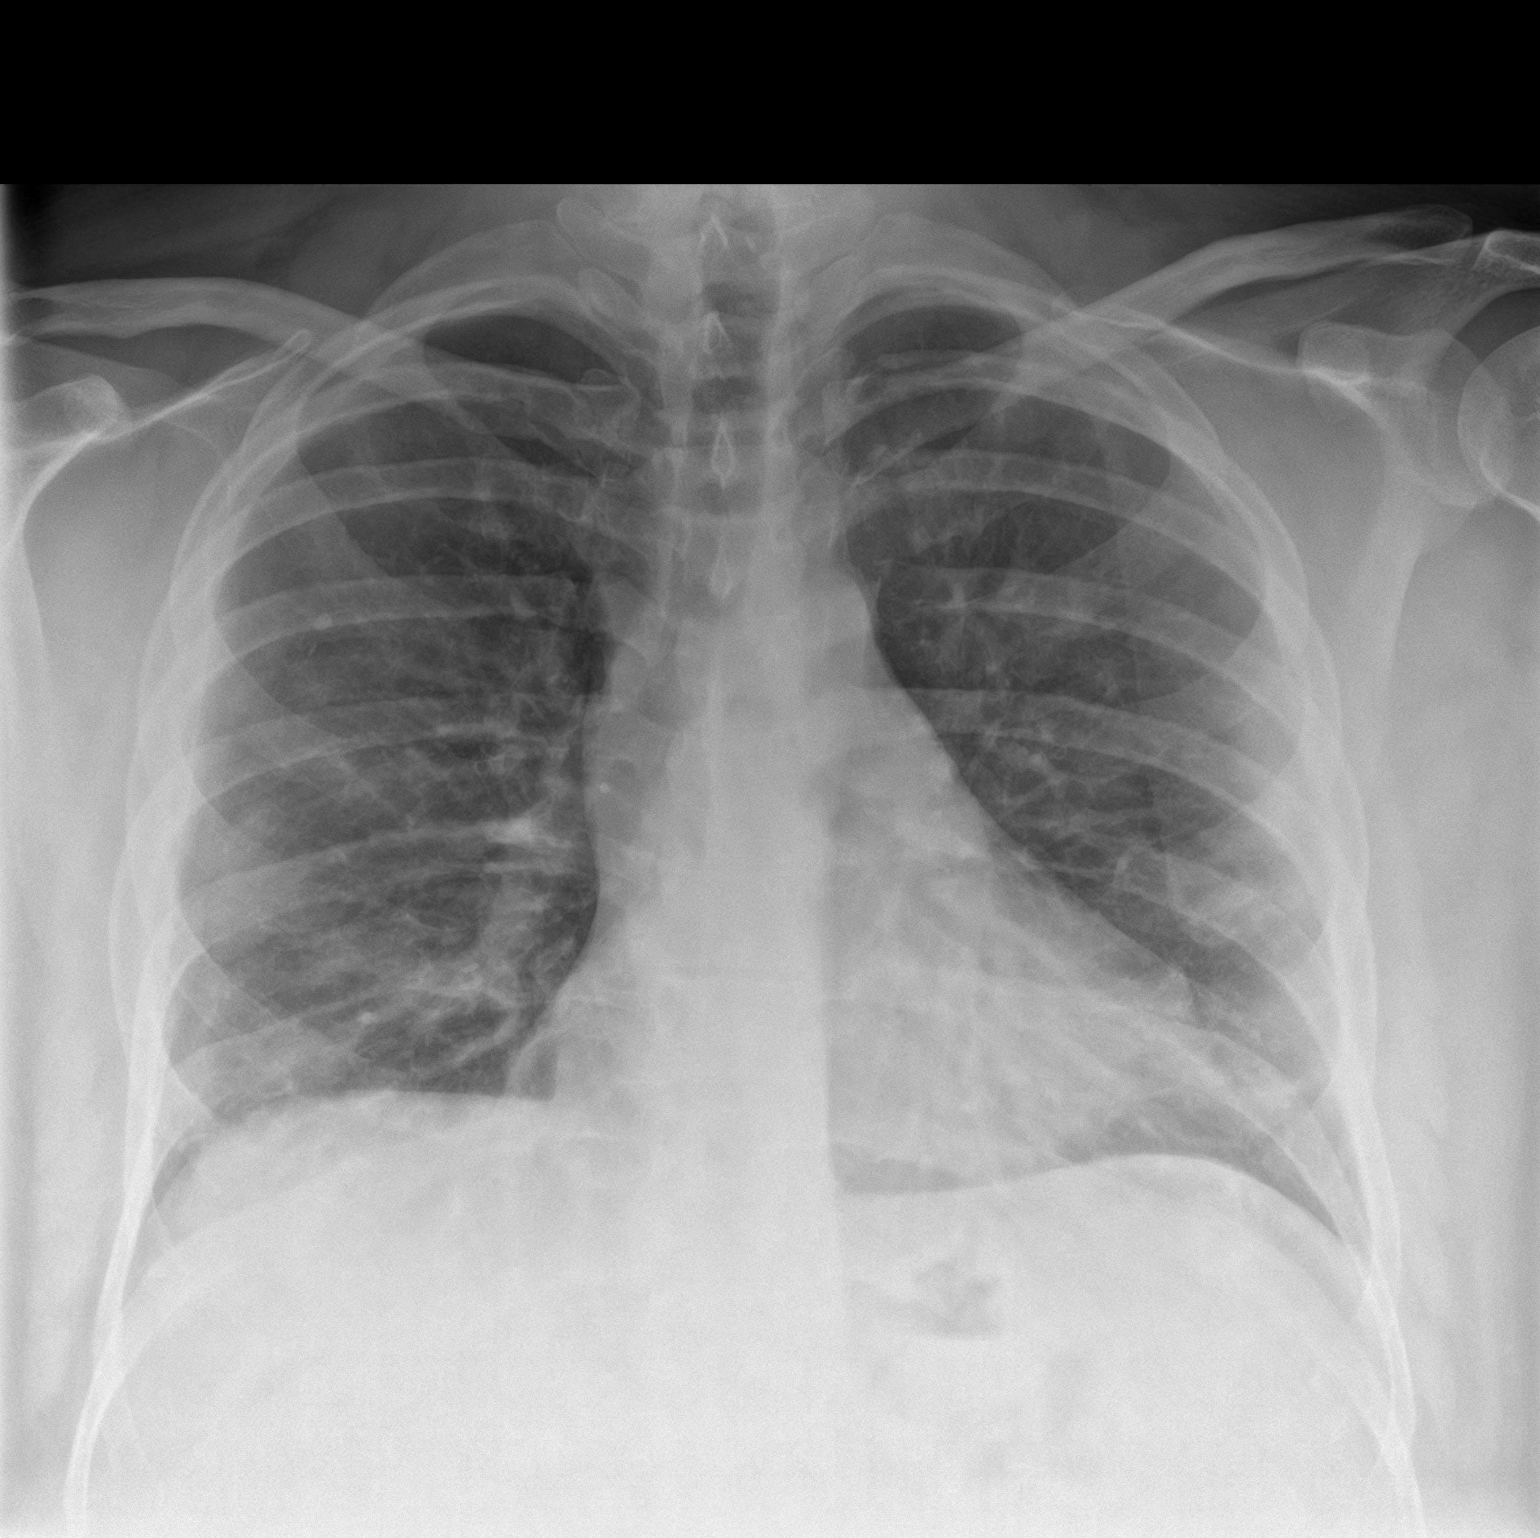

[chest lat]
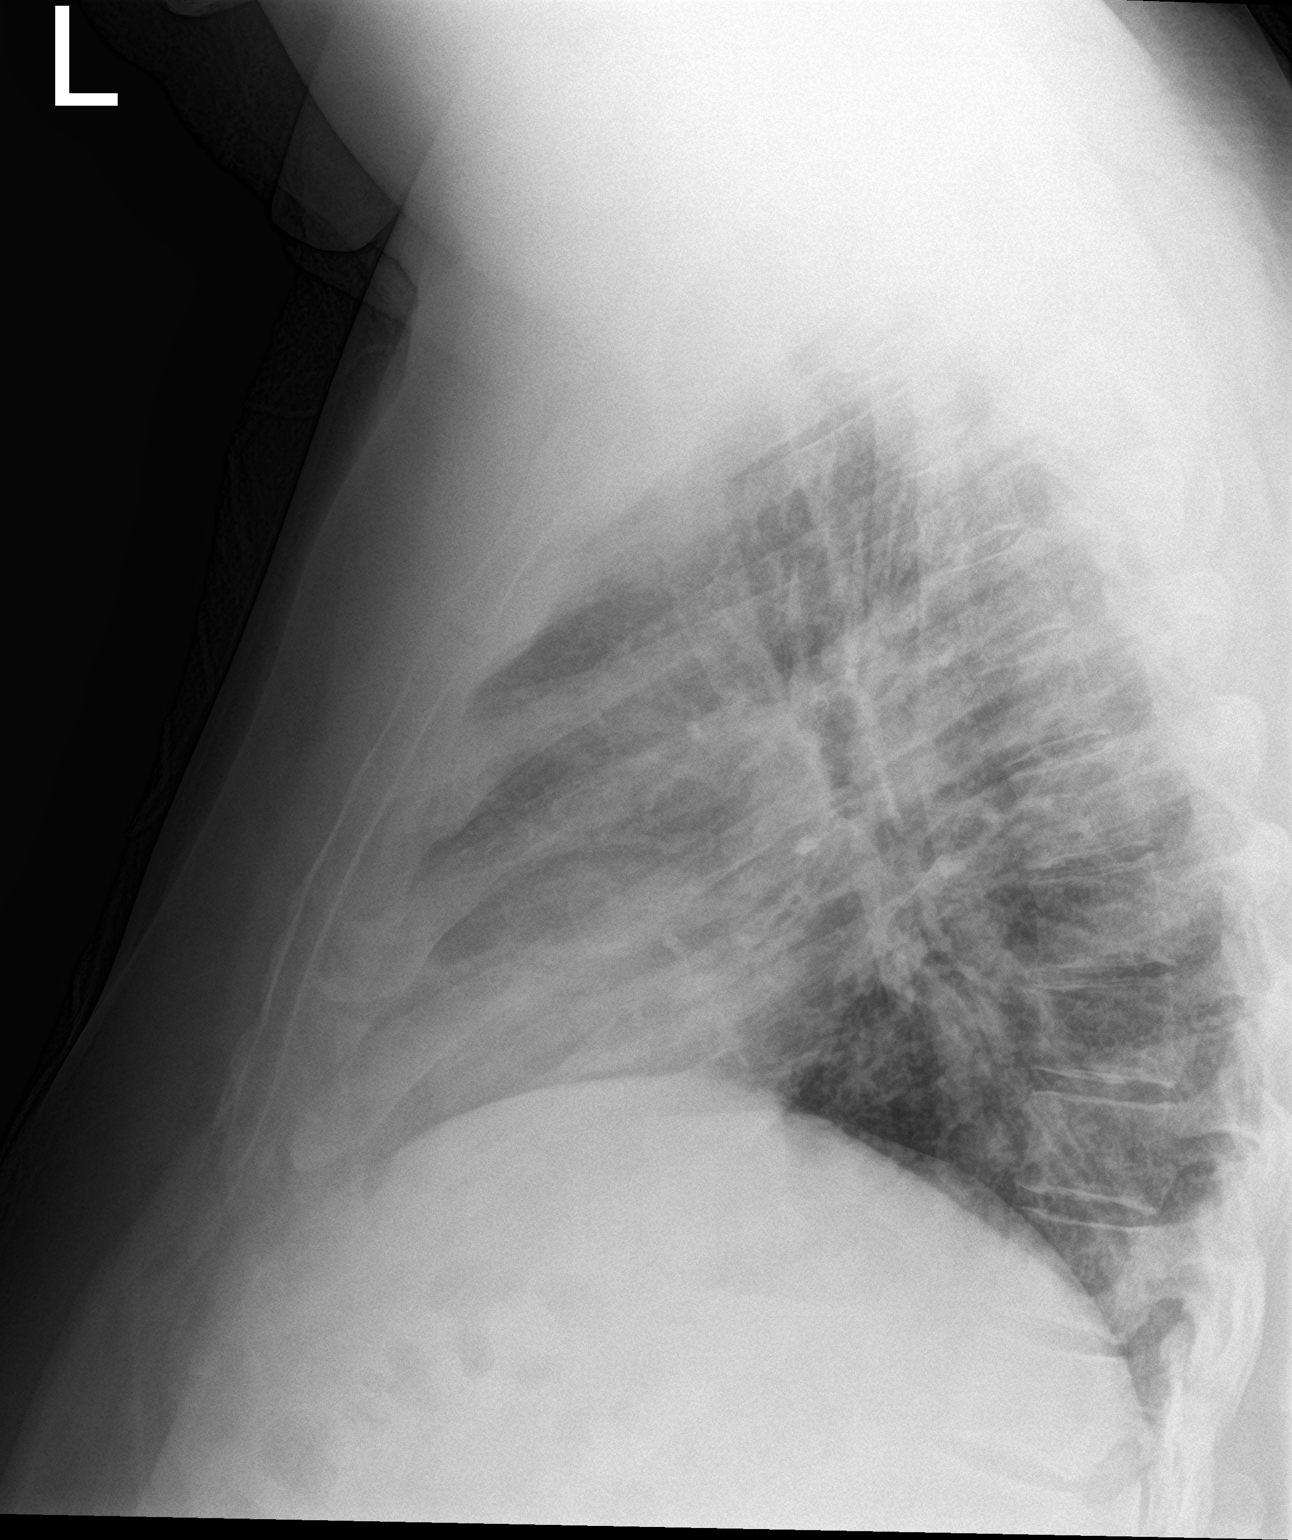

[2 of 2 positions shown; findings below may reference images not displayed]

FINDINGS: There are patchy and ill-defined areas of airspace opacity in both
lungs with some mild airways thickening. No pneumothorax or
effusion. The cardiomediastinal contours are unremarkable. No acute
osseous or soft tissue abnormality.
IMPRESSION: Patchy and ill-defined areas of airspace opacity in both lungs,
consistent with multifocal infection/inflammation in the setting of
[id number]-[id number].

## 2023-10-05 DIAGNOSIS — Z419 Encounter for procedure for purposes other than remedying health state, unspecified: Secondary | ICD-10-CM | POA: Diagnosis not present
